# Patient Record
Sex: Female | Born: 1985 | Race: White | Hispanic: No | Marital: Married | Smoking: Former smoker
Health system: Southern US, Community
[De-identification: ages and names within clinical notes are randomized; demographics above are authoritative.]

## PROBLEM LIST (undated history)

## (undated) DIAGNOSIS — C50919 Malignant neoplasm of unspecified site of unspecified female breast: Secondary | ICD-10-CM

## (undated) DIAGNOSIS — C7800 Secondary malignant neoplasm of unspecified lung: Secondary | ICD-10-CM

---
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
## 2018-01-29 DIAGNOSIS — C7800 Secondary malignant neoplasm of unspecified lung: Secondary | ICD-10-CM

## 2018-01-29 DIAGNOSIS — C50919 Malignant neoplasm of unspecified site of unspecified female breast: Secondary | ICD-10-CM

## 2018-01-29 HISTORY — DX: Secondary malignant neoplasm of unspecified lung: C78.00

## 2018-01-29 HISTORY — DX: Malignant neoplasm of unspecified site of unspecified female breast: C50.919

## 2018-02-18 DIAGNOSIS — C773 Secondary and unspecified malignant neoplasm of axilla and upper limb lymph nodes: Secondary | ICD-10-CM | POA: Diagnosis not present

## 2018-02-18 DIAGNOSIS — Z171 Estrogen receptor negative status [ER-]: Secondary | ICD-10-CM | POA: Diagnosis not present

## 2018-02-18 DIAGNOSIS — C50112 Malignant neoplasm of central portion of left female breast: Secondary | ICD-10-CM | POA: Diagnosis not present

## 2018-02-20 DIAGNOSIS — C778 Secondary and unspecified malignant neoplasm of lymph nodes of multiple regions: Secondary | ICD-10-CM | POA: Diagnosis not present

## 2018-02-20 DIAGNOSIS — Z171 Estrogen receptor negative status [ER-]: Secondary | ICD-10-CM | POA: Diagnosis not present

## 2018-02-20 DIAGNOSIS — C50112 Malignant neoplasm of central portion of left female breast: Secondary | ICD-10-CM | POA: Diagnosis not present

## 2018-02-27 DIAGNOSIS — C7801 Secondary malignant neoplasm of right lung: Secondary | ICD-10-CM

## 2018-02-27 DIAGNOSIS — Z171 Estrogen receptor negative status [ER-]: Secondary | ICD-10-CM | POA: Diagnosis not present

## 2018-02-27 DIAGNOSIS — G893 Neoplasm related pain (acute) (chronic): Secondary | ICD-10-CM | POA: Diagnosis not present

## 2018-02-27 DIAGNOSIS — C778 Secondary and unspecified malignant neoplasm of lymph nodes of multiple regions: Secondary | ICD-10-CM | POA: Diagnosis not present

## 2018-02-27 DIAGNOSIS — C50112 Malignant neoplasm of central portion of left female breast: Secondary | ICD-10-CM | POA: Diagnosis not present

## 2018-03-04 DIAGNOSIS — I517 Cardiomegaly: Secondary | ICD-10-CM | POA: Diagnosis not present

## 2018-03-04 DIAGNOSIS — C50112 Malignant neoplasm of central portion of left female breast: Secondary | ICD-10-CM | POA: Diagnosis not present

## 2018-03-18 DIAGNOSIS — Z0001 Encounter for general adult medical examination with abnormal findings: Secondary | ICD-10-CM

## 2018-03-28 DIAGNOSIS — C50112 Malignant neoplasm of central portion of left female breast: Secondary | ICD-10-CM | POA: Diagnosis not present

## 2018-04-05 DIAGNOSIS — R0981 Nasal congestion: Secondary | ICD-10-CM

## 2018-04-05 DIAGNOSIS — R509 Fever, unspecified: Secondary | ICD-10-CM

## 2018-04-05 DIAGNOSIS — A419 Sepsis, unspecified organism: Secondary | ICD-10-CM | POA: Diagnosis not present

## 2018-04-05 DIAGNOSIS — D72829 Elevated white blood cell count, unspecified: Secondary | ICD-10-CM | POA: Diagnosis not present

## 2018-04-05 DIAGNOSIS — R05 Cough: Secondary | ICD-10-CM | POA: Diagnosis not present

## 2018-04-05 DIAGNOSIS — R1310 Dysphagia, unspecified: Secondary | ICD-10-CM

## 2018-04-05 DIAGNOSIS — B349 Viral infection, unspecified: Secondary | ICD-10-CM

## 2018-04-05 DIAGNOSIS — C50912 Malignant neoplasm of unspecified site of left female breast: Secondary | ICD-10-CM

## 2018-04-05 DIAGNOSIS — C50112 Malignant neoplasm of central portion of left female breast: Secondary | ICD-10-CM

## 2018-04-06 DIAGNOSIS — D72829 Elevated white blood cell count, unspecified: Secondary | ICD-10-CM | POA: Diagnosis not present

## 2018-04-06 DIAGNOSIS — C50112 Malignant neoplasm of central portion of left female breast: Secondary | ICD-10-CM

## 2018-04-06 DIAGNOSIS — J189 Pneumonia, unspecified organism: Secondary | ICD-10-CM | POA: Diagnosis not present

## 2018-04-06 DIAGNOSIS — J9601 Acute respiratory failure with hypoxia: Secondary | ICD-10-CM

## 2018-04-06 DIAGNOSIS — J069 Acute upper respiratory infection, unspecified: Secondary | ICD-10-CM | POA: Diagnosis not present

## 2018-04-06 DIAGNOSIS — D649 Anemia, unspecified: Secondary | ICD-10-CM | POA: Diagnosis not present

## 2018-04-06 DIAGNOSIS — R0902 Hypoxemia: Secondary | ICD-10-CM

## 2018-04-07 DIAGNOSIS — J9601 Acute respiratory failure with hypoxia: Secondary | ICD-10-CM | POA: Diagnosis not present

## 2018-04-07 DIAGNOSIS — D72829 Elevated white blood cell count, unspecified: Secondary | ICD-10-CM | POA: Diagnosis not present

## 2018-04-07 DIAGNOSIS — D649 Anemia, unspecified: Secondary | ICD-10-CM | POA: Diagnosis not present

## 2018-04-07 DIAGNOSIS — J189 Pneumonia, unspecified organism: Secondary | ICD-10-CM | POA: Diagnosis not present

## 2018-04-08 ENCOUNTER — Inpatient Hospital Stay (HOSPITAL_COMMUNITY)
Admission: AD | Admit: 2018-04-08 | Discharge: 2018-04-15 | DRG: 205 | Disposition: A | Discharge status: Home / Self Care | Payer: BLUE CROSS/BLUE SHIELD | Source: Other Acute Inpatient Hospital | Attending: Internal Medicine | Admitting: Internal Medicine

## 2018-04-08 ENCOUNTER — Inpatient Hospital Stay (HOSPITAL_COMMUNITY): Payer: BLUE CROSS/BLUE SHIELD

## 2018-04-08 ENCOUNTER — Encounter (HOSPITAL_COMMUNITY): Payer: Self-pay | Admitting: Infectious Diseases

## 2018-04-08 DIAGNOSIS — Z1501 Genetic susceptibility to malignant neoplasm of breast: Secondary | ICD-10-CM | POA: Diagnosis not present

## 2018-04-08 DIAGNOSIS — R000 Tachycardia, unspecified: Secondary | ICD-10-CM

## 2018-04-08 DIAGNOSIS — Z170 Estrogen receptor positive status [ER+]: Secondary | ICD-10-CM | POA: Diagnosis not present

## 2018-04-08 DIAGNOSIS — J181 Lobar pneumonia, unspecified organism: Secondary | ICD-10-CM | POA: Diagnosis not present

## 2018-04-08 DIAGNOSIS — E8770 Fluid overload, unspecified: Secondary | ICD-10-CM | POA: Diagnosis present

## 2018-04-08 DIAGNOSIS — C7800 Secondary malignant neoplasm of unspecified lung: Secondary | ICD-10-CM | POA: Diagnosis present

## 2018-04-08 DIAGNOSIS — Z9221 Personal history of antineoplastic chemotherapy: Secondary | ICD-10-CM | POA: Diagnosis not present

## 2018-04-08 DIAGNOSIS — R509 Fever, unspecified: Secondary | ICD-10-CM | POA: Diagnosis present

## 2018-04-08 DIAGNOSIS — D649 Anemia, unspecified: Secondary | ICD-10-CM | POA: Diagnosis not present

## 2018-04-08 DIAGNOSIS — T451X5A Adverse effect of antineoplastic and immunosuppressive drugs, initial encounter: Secondary | ICD-10-CM | POA: Diagnosis present

## 2018-04-08 DIAGNOSIS — J189 Pneumonia, unspecified organism: Secondary | ICD-10-CM | POA: Diagnosis present

## 2018-04-08 DIAGNOSIS — Z809 Family history of malignant neoplasm, unspecified: Secondary | ICD-10-CM | POA: Diagnosis not present

## 2018-04-08 DIAGNOSIS — R0989 Other specified symptoms and signs involving the circulatory and respiratory systems: Secondary | ICD-10-CM | POA: Diagnosis not present

## 2018-04-08 DIAGNOSIS — J80 Acute respiratory distress syndrome: Secondary | ICD-10-CM

## 2018-04-08 DIAGNOSIS — Z79899 Other long term (current) drug therapy: Secondary | ICD-10-CM | POA: Diagnosis not present

## 2018-04-08 DIAGNOSIS — Z95828 Presence of other vascular implants and grafts: Secondary | ICD-10-CM | POA: Diagnosis not present

## 2018-04-08 DIAGNOSIS — Z880 Allergy status to penicillin: Secondary | ICD-10-CM | POA: Diagnosis not present

## 2018-04-08 DIAGNOSIS — Z885 Allergy status to narcotic agent status: Secondary | ICD-10-CM | POA: Diagnosis not present

## 2018-04-08 DIAGNOSIS — C50919 Malignant neoplasm of unspecified site of unspecified female breast: Secondary | ICD-10-CM

## 2018-04-08 DIAGNOSIS — C50912 Malignant neoplasm of unspecified site of left female breast: Secondary | ICD-10-CM

## 2018-04-08 DIAGNOSIS — R0602 Shortness of breath: Secondary | ICD-10-CM

## 2018-04-08 DIAGNOSIS — R918 Other nonspecific abnormal finding of lung field: Secondary | ICD-10-CM

## 2018-04-08 DIAGNOSIS — J9601 Acute respiratory failure with hypoxia: Secondary | ICD-10-CM | POA: Diagnosis present

## 2018-04-08 DIAGNOSIS — F329 Major depressive disorder, single episode, unspecified: Secondary | ICD-10-CM | POA: Diagnosis present

## 2018-04-08 DIAGNOSIS — R0902 Hypoxemia: Secondary | ICD-10-CM

## 2018-04-08 DIAGNOSIS — F419 Anxiety disorder, unspecified: Secondary | ICD-10-CM | POA: Diagnosis present

## 2018-04-08 DIAGNOSIS — J704 Drug-induced interstitial lung disorders, unspecified: Secondary | ICD-10-CM | POA: Diagnosis present

## 2018-04-08 DIAGNOSIS — D72829 Elevated white blood cell count, unspecified: Secondary | ICD-10-CM | POA: Diagnosis not present

## 2018-04-08 DIAGNOSIS — R197 Diarrhea, unspecified: Secondary | ICD-10-CM | POA: Diagnosis not present

## 2018-04-08 DIAGNOSIS — J9690 Respiratory failure, unspecified, unspecified whether with hypoxia or hypercapnia: Secondary | ICD-10-CM

## 2018-04-08 DIAGNOSIS — J168 Pneumonia due to other specified infectious organisms: Secondary | ICD-10-CM | POA: Diagnosis not present

## 2018-04-08 DIAGNOSIS — R110 Nausea: Secondary | ICD-10-CM | POA: Diagnosis not present

## 2018-04-08 HISTORY — DX: Secondary malignant neoplasm of unspecified lung: C78.00

## 2018-04-08 HISTORY — DX: Malignant neoplasm of unspecified site of unspecified female breast: C50.919

## 2018-04-08 LAB — GLUCOSE, CAPILLARY: Glucose-Capillary: 131 mg/dL — ABNORMAL HIGH (ref 70–99)

## 2018-04-08 LAB — PHOSPHORUS: Phosphorus: 1.8 mg/dL — ABNORMAL LOW (ref 2.5–4.6)

## 2018-04-08 LAB — COMPREHENSIVE METABOLIC PANEL
ALK PHOS: 119 U/L (ref 38–126)
ALT: 20 U/L (ref 0–44)
ANION GAP: 9 (ref 5–15)
AST: 37 U/L (ref 15–41)
Albumin: 2.8 g/dL — ABNORMAL LOW (ref 3.5–5.0)
BILIRUBIN TOTAL: 0.2 mg/dL — AB (ref 0.3–1.2)
BUN: <5 mg/dL — ABNORMAL LOW (ref 6–20)
CALCIUM: 8.7 mg/dL — AB (ref 8.9–10.3)
CO2: 23 mmol/L (ref 22–32)
CREATININE: 0.58 mg/dL (ref 0.44–1.00)
Chloride: 106 mmol/L (ref 98–111)
GFR calc non Af Amer: >60 mL/min (ref >60)
Glucose, Bld: 128 mg/dL — ABNORMAL HIGH (ref 70–99)
Potassium: 4.5 mmol/L (ref 3.5–5.1)
SODIUM: 138 mmol/L (ref 135–145)
Total Protein: 6.4 g/dL — ABNORMAL LOW (ref 6.5–8.1)

## 2018-04-08 LAB — LACTIC ACID, PLASMA
LACTIC ACID, VENOUS: 1.4 mmol/L (ref 0.5–1.9)
LACTIC ACID, VENOUS: 1 mmol/L (ref 0.5–1.9)

## 2018-04-08 LAB — MAGNESIUM: Magnesium: 1.9 mg/dL (ref 1.7–2.4)

## 2018-04-08 LAB — URINALYSIS, ROUTINE W REFLEX MICROSCOPIC
Bacteria, UA: NONE SEEN
Bilirubin Urine: NEGATIVE
Glucose, UA: NEGATIVE mg/dL
KETONES UR: NEGATIVE mg/dL
Leukocytes, UA: NEGATIVE
Nitrite: NEGATIVE
PROTEIN: NEGATIVE mg/dL
Specific Gravity, Urine: 1.009 (ref 1.005–1.030)
pH: 7 (ref 5.0–8.0)

## 2018-04-08 LAB — CBC
HEMATOCRIT: 32.9 % — AB (ref 36.0–46.0)
Hemoglobin: 10.4 g/dL — ABNORMAL LOW (ref 12.0–15.0)
MCH: 30.5 pg (ref 26.0–34.0)
MCHC: 31.6 g/dL (ref 30.0–36.0)
MCV: 96.5 fL (ref 78.0–100.0)
Platelets: 263 10*3/uL (ref 150–400)
RBC: 3.41 MIL/uL — AB (ref 3.87–5.11)
RDW: 14.8 % (ref 11.5–15.5)
WBC: 48 10*3/uL — ABNORMAL HIGH (ref 4.0–10.5)

## 2018-04-08 LAB — CORTISOL: CORTISOL PLASMA: 10.2 ug/dL

## 2018-04-08 LAB — APTT: APTT: 33 s (ref 24–36)

## 2018-04-08 LAB — MRSA PCR SCREENING: MRSA BY PCR: NEGATIVE

## 2018-04-08 LAB — PROCALCITONIN: Procalcitonin: 0.19 ng/mL

## 2018-04-08 LAB — PROTIME-INR
INR: 1.14
Prothrombin Time: 14.5 seconds (ref 11.4–15.2)

## 2018-04-08 LAB — BRAIN NATRIURETIC PEPTIDE: B NATRIURETIC PEPTIDE 5: 326.3 pg/mL — AB (ref 0.0–100.0)

## 2018-04-08 MED ORDER — WHITE PETROLATUM EX OINT
TOPICAL_OINTMENT | CUTANEOUS | Status: AC
  Administered 2018-04-08: 1
  Filled 2018-04-08: qty 28.35

## 2018-04-08 MED ORDER — LEVALBUTEROL HCL 0.63 MG/3ML IN NEBU
INHALATION_SOLUTION | RESPIRATORY_TRACT | Status: AC
  Administered 2018-04-08: 0.63 mg via RESPIRATORY_TRACT
  Filled 2018-04-08: qty 3

## 2018-04-08 MED ORDER — SODIUM CHLORIDE 0.9 % IV SOLN
250.0000 mL | INTRAVENOUS | Status: DC | PRN
  Administered 2018-04-08 – 2018-04-09 (×2): 250 mL via INTRAVENOUS

## 2018-04-08 MED ORDER — OXYCODONE HCL 5 MG PO TABS
5.0000 mg | ORAL_TABLET | Freq: Four times a day (QID) | ORAL | Status: DC | PRN
  Administered 2018-04-08 – 2018-04-10 (×7): 5 mg via ORAL
  Filled 2018-04-08 (×7): qty 1

## 2018-04-08 MED ORDER — FAMOTIDINE IN NACL 20-0.9 MG/50ML-% IV SOLN
20.0000 mg | Freq: Two times a day (BID) | INTRAVENOUS | Status: DC
  Administered 2018-04-08 – 2018-04-09 (×3): 20 mg via INTRAVENOUS
  Filled 2018-04-08 (×4): qty 50

## 2018-04-08 MED ORDER — ONDANSETRON HCL 4 MG/2ML IJ SOLN: 4.0000 mg | Freq: Four times a day (QID) | INTRAMUSCULAR | Status: DC

## 2018-04-08 MED ORDER — MIDAZOLAM HCL 2 MG/2ML IJ SOLN: 1.0000 mg | INTRAMUSCULAR | Status: DC | PRN

## 2018-04-08 MED ORDER — HEPARIN SODIUM (PORCINE) 5000 UNIT/ML IJ SOLN
5000.0000 [IU] | Freq: Three times a day (TID) | INTRAMUSCULAR | Status: DC
  Administered 2018-04-08 – 2018-04-11 (×9): 5000 [IU] via SUBCUTANEOUS
  Filled 2018-04-08 (×8): qty 1

## 2018-04-08 MED ORDER — SODIUM CHLORIDE 0.9 % IV SOLN
1250.0000 mg | Freq: Three times a day (TID) | INTRAVENOUS | Status: DC
  Administered 2018-04-08 – 2018-04-09 (×3): 1250 mg via INTRAVENOUS
  Filled 2018-04-08 (×4): qty 1250

## 2018-04-08 MED ORDER — ONDANSETRON HCL 4 MG/2ML IJ SOLN
4.0000 mg | Freq: Four times a day (QID) | INTRAMUSCULAR | Status: DC | PRN
  Administered 2018-04-08 – 2018-04-12 (×8): 4 mg via INTRAVENOUS
  Filled 2018-04-08 (×8): qty 2

## 2018-04-08 MED ORDER — SODIUM CHLORIDE 0.9 % IV SOLN
INTRAVENOUS | Status: DC
  Administered 2018-04-08: 15:00:00 via INTRAVENOUS

## 2018-04-08 MED ORDER — SODIUM CHLORIDE 0.9 % IV SOLN
2.0000 g | Freq: Three times a day (TID) | INTRAVENOUS | Status: DC
  Administered 2018-04-08 – 2018-04-14 (×18): 2 g via INTRAVENOUS
  Filled 2018-04-08 (×19): qty 2

## 2018-04-08 MED ORDER — LEVALBUTEROL HCL 0.63 MG/3ML IN NEBU
0.6300 mg | INHALATION_SOLUTION | RESPIRATORY_TRACT | Status: DC | PRN
  Administered 2018-04-08 – 2018-04-10 (×5): 0.63 mg via RESPIRATORY_TRACT
  Filled 2018-04-08 (×4): qty 3

## 2018-04-08 NOTE — Progress Notes (Signed)
Upon arrival to patient room to do assessment patient stated that she was feeling nauseated.  Patient was taken off of bipap and placed on 15L salter high-flow nasal cannula and told RN in order to give her some medication.  Currently tolerating well.  Will continue to monitor.

## 2018-04-08 NOTE — Progress Notes (Signed)
Pharmacy Antibiotic Note  Kelly Hubbard is a 32 y.o. female admitted on 04/08/2018 with pneumonia.  Pharmacy has been consulted for vancomycin and cefepime dosing. Patient transferred from [facility] where she was on Vancomycin 1 gm IV q 8 hours (Trough on vanc 1.5 gm q 12 was low at 9.8). Last dose was yesterday. She was also on Aztreonam, bactrim and caspofungin. Patient's husband concerned she may be allergic to Zosyn (Rash). Of note, today is D#4 of abx   Cultures at OSH:  7/5 BCx NGTD 6/5 Sputum > normal floa 7/5 BCx NGTD MRSA PCR neg   Plan: -Start Cefepime 2 gm IV Q 8 hours  -Start vancomycin 1250 mg IV Q 8 hours -Monitor CBC, renal fx, cultures and clinical progress -VT at SS      Temp (24hrs), Avg:98.5 F (36.9 C), Min:98.5 F (36.9 C), Max:98.5 F (36.9 C)  No results for input(s): WBC, CREATININE, LATICACIDVEN, VANCOTROUGH, VANCOPEAK, VANCORANDOM, GENTTROUGH, GENTPEAK, GENTRANDOM, TOBRATROUGH, TOBRAPEAK, TOBRARND, AMIKACINPEAK, AMIKACINTROU, AMIKACIN in the last 168 hours.  CrCl cannot be calculated (No order found.).    Allergies not on file  Antimicrobials this admission: Vanc 7/9 >>  Cefepime 7/9 >>   Dose adjustments this admission: None   Microbiology results:   Thank you for allowing pharmacy to be a part of this patient's care.  Albertina Parr, PharmD., BCPS Clinical Pharmacist Clinical phone for 04/08/18 until 3:30pm: [phone] If after 3:30pm, please refer to [facility] for unit-specific pharmacist

## 2018-04-08 NOTE — Progress Notes (Signed)
Patient arrived from [facility] wearing bipap and placed on our bipap and was currently tolerating well.  Will continue to monitor.

## 2018-04-08 NOTE — Consult Note (Signed)
[name] for Infectious Disease    Date of Admission:  04/08/2018   Total days of antibiotics 3        Day 2 Vancomycin         Day 1 Cefepime               Reason for Consult: HCAP in cancer patient     Referring Provider: [facility]  Primary Care Provider: Patient, No Pcp Per   Assessment: 32 y.o. 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 She is receiving Trastuzumab and Trastuzumab as part of her cancer treatment. She has significant leukocytosis in the setting of possible pneumonia as well as Neulasta injections - uncertain what her WBC/ANC counts were like baseline. Her lung exam is abnormal with crackles L>R and overall decreased breath sounds. Requiring 65% oxygen. I do think that some degree of fluid overload is playing a role and may need to consider diuresis if she remains stable hemodynamically. She is getting Herceptin/Perjeta which is known for cardiotoxic effect; she tells me she had an echocardiogram prior to therapy but may consider re-evaluating. With negative nasal PCR would continue Cefepime for now. No culture data to support MRSA pneumonia. Agree with stopping antifungal therapy and no indication for carbapenem.   Plan: 1. Stop Vancomycin  2. Continue Cefepime    Will follow along.   Active Problems:   Acute respiratory failure with hypoxemia (HCC)   Fever   . heparin  5,000 Units Subcutaneous Q8H    HPI: Kelly Hubbard is a 32 y.o. female transferred to [facility] ICU from [place] ICU on 04/08/2018 for management of her worsening respiratory failure/HCAP. 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 She also receives Neulasta (last dose 03/29/18). She has not had surgery or XRT per records and husband's account.   In discussion with her and her husband last Tuesday 7/2 noticed she had a slight fever of 100 F. They called to discuss with oncology team - no other symptoms outside of isolated temperature so recommended observation and tylenol/ibuprofen. No more fevers until Friday around noon when it was noticed she started with a cough. Temperature later that evening up to 100.8 F and now progressive shortness of breath and productive cough with reported green phlegm. Her husband then brought her to the hospital for evaluation. Other associated symptoms that evolved since Friday include sore throat, runny nose/congestion. She presented to the hospital with fever of 100.8 F. Initiation of antibiotics initially made her feel better but then progressed to have worsened respiratory status requiring BiPAP after aggressive IVF (6 L per her husband's account) in the setting of hypotension. She is now breathing much more comfortably with BiPAP and tells me she is going to trial off of this later this evening. Sore throat has resolved and cough is no longer productive. No fevers recorded since 7/7.   There is question of PCN allergy as she had a "reaction" to what seemed to be piperacillin-tazobactam. Her husband describes that this included "severe muscle spasms that would happen about an hour after infusions." He ultimately requested to change medications.   Regarding antibiotic  regimen - Initially was treated with Ceftriaxone on 7/6 at ED presentation. Changed to vancomycin + pip-tazo with admission. 7/7 continued vancomycin with aztreonam (?reaction to pip-tazo) and added azithromycin. 7/8 added bactrim and capsofungin. Prednisone 25m QD was also added. Prior to transfer the vancomycin was stopped with negative MRSA PCR as well as bactrim and transitioned to Imipenem.   Review of  Systems  Constitutional: Positive for fever and malaise/fatigue. Negative for chills and weight loss.  HENT: Positive for congestion and sore throat. Negative for sinus pain.   Eyes: Positive for pain.  Respiratory: Positive for cough, sputum production and shortness of breath. Negative for hemoptysis and wheezing.   Cardiovascular: Positive for chest pain and leg swelling.  Gastrointestinal: Negative for abdominal pain, diarrhea and vomiting.  Genitourinary: Negative for dysuria and hematuria.  Musculoskeletal: Negative for joint pain and myalgias.  Skin: Negative for rash.  Neurological: Positive for headaches.    Past Medical History:  Diagnosis Date  . Breast cancer metastasized to lung ([facility] 01/2018    Social History   Tobacco Use  . Smoking status: Not on file  Substance Use Topics  . Alcohol use: Not on file  . Drug use: Not on file    Family History  Problem Relation Age of Onset  . Heart Problems Mother   . Hypertension Mother   . Diabetes Father   . Cancer Maternal Grandmother   . Stroke Maternal Grandmother    Allergies  Allergen Reactions  . Meperidine And Related Hives    OBJECTIVE: Blood pressure 115/79, pulse 100, temperature 98.5 F (36.9 C), temperature source Oral, resp. rate (!) 29, height 4' 11"  (1.499 m), weight 147 lb 11.3 oz (67 kg), last menstrual period 04/08/2018, SpO2 98 %.  Physical Exam  Constitutional: She is oriented to person, place, and time. She appears well-developed. No distress.  Ill-appearing young woman in bed  HENT:  Mouth/Throat: Oropharynx is clear and moist.  Eyes: Pupils are equal, round, and reactive to light. Right eye exhibits no discharge. Left eye exhibits no discharge. No scleral icterus.  Neck: No JVD present.  Cardiovascular: Normal rate, regular rhythm and normal heart sounds.  No murmur heard. Pulmonary/Chest: Effort normal. No respiratory distress. She has no wheezes. She has rales (L>R).  Abdominal: Soft.  Bowel sounds are normal. She exhibits no distension. There is no tenderness.  Musculoskeletal: She exhibits no edema.  Lymphadenopathy:    She has no cervical adenopathy.  Neurological: She is alert and oriented to person, place, and time.  Skin: Skin is warm and dry.  Psychiatric: She has a normal mood and affect. Judgment normal.    Lab Results Lab Results  Component Value Date   WBC 48.0 (H) 04/08/2018   HGB 10.4 (L) 04/08/2018   HCT 32.9 (L) 04/08/2018   MCV 96.5 04/08/2018   PLT 263 04/08/2018    Lab Results  Component Value Date   CREATININE 0.58 04/08/2018   BUN <5 (L) 04/08/2018   NA 138 04/08/2018   K 4.5 04/08/2018   CL 106 04/08/2018   CO2 23 04/08/2018    Lab Results  Component Value Date   ALT 20 04/08/2018   AST 37 04/08/2018   ALKPHOS 119 04/08/2018   BILITOT 0.2 (L) 04/08/2018     Microbiology: Blood Cx 7/9>>> Urine Cx 7/9>>> Sputum Cx 7/9>>>  [phone] Strep A screen - neg  Blood Culture - No growth from either blood cultures @ 24h  Resp Cx - normal  respiratory flora  MRSA PCR - neg  PJP screen - never collected  RVP - pending   Janene Madeira, MSN, NP-C [facility] for Infectious [place] Cell: [phone] Pager: [phone]  04/08/2018 5:35 PM   \

## 2018-04-08 NOTE — H&P (Signed)
PULMONARY / CRITICAL CARE MEDICINE   Name: Kelly Hubbard MRN: [id number] DOB: 25-Dec-1985    ADMISSION DATE:  04/08/2018 CONSULTATION DATE:  04/08/2018  REFERRING MD:  Olga Coaster hospitalis  CHIEF COMPLAINT:  HCAP and Acute respiratory failure  HISTORY OF PRESENT ILLNESS:   32 year old female with recent diagnosis (01/2018) of stage IV breast cancer with mets to the lungs HER2 positive that is on 2 biologics, no RT or surgical interventions.  Patient is on a BiPAP and history is very hard to obtain.  From records, presents to [place] with SOB and was noted to have multiple opacities on chest CT with worsening infiltrate.  Patient was transferred to the ICU in [place] and continued to deteriorate and BiPAP was started and PCCM was asked to accept on transfer on 7/9.    PAST MEDICAL HISTORY :  Stage IV breast adeno with mets to the lungs  PAST SURGICAL HISTORY: TAH-BSO  Allergies not on file ?PEN  No current facility-administered medications on file prior to encounter.    No current outpatient medications on file prior to encounter.   FAMILY HISTORY:  Her family history is not on file.  SOCIAL HISTORY: She    REVIEW OF SYSTEMS:   Unattainable  SUBJECTIVE:  SOB and cough  VITAL SIGNS: Temp 98.5 F (36.9 C) (Axillary)   HEMODYNAMICS:  HR 110, BP 113/75 and sat of 99% on BiPAP 10/5 and FiO2 of 65%  VENTILATOR SETTINGS:    INTAKE / OUTPUT: No intake/output data recorded.  PHYSICAL EXAMINATION: General:  Acute on chronically ill appearing female, NAD Neuro:  Alert and interactive but difficulty to understand due to BiPAP, moving all ext to command HEENT:  [place]/AT, PERRL, EOM-I and MMM Cardiovascular:  RRR, Nl S1/S2 and -M/R/G Lungs:  Diffuse rales Abdomen:  Soft, NT, ND and +BS Musculoskeletal:  -edema and -tenderness Skin:  Intact  LABS:  BMET No results for input(s): NA, K, CL, CO2, BUN, CREATININE, GLUCOSE in the last 168  hours.  Electrolytes No results for input(s): CALCIUM, MG, PHOS in the last 168 hours.  CBC No results for input(s): WBC, HGB, HCT, PLT in the last 168 hours.  Coag's No results for input(s): APTT, INR in the last 168 hours.  Sepsis Markers No results for input(s): LATICACIDVEN, PROCALCITON, O2SATVEN in the last 168 hours.  ABG No results for input(s): PHART, PCO2ART, PO2ART in the last 168 hours.  Liver Enzymes No results for input(s): AST, ALT, ALKPHOS, BILITOT, ALBUMIN in the last 168 hours.  Cardiac Enzymes No results for input(s): TROPONINI, PROBNP in the last 168 hours.  Glucose Recent Labs  Lab 04/08/18 1324  GLUCAP 131*    Imaging CXR that I reviewed myself, bibasilar infiltrate noted  STUDIES:  CXR that I reviewed myself with bibasilar infiltrates noted  CULTURES: Blood 7/9>>> Urine 7/9>>> Sputum 7/9>>>  ANTIBIOTICS: Cefepime 7/9>>> Vancomycin 7/9>>>  SIGNIFICANT EVENTS:  7/9>>>transfer to [facility]ICU for respiratory failure from HCAP  LINES/TUBES: R [place]  DISCUSSION: 32 year old female with stage 4 breast cancer presenting to PCCM with respiratory failure from HCAP and lung mets from breast cancer.  Discussed with PCCM-NP.  ASSESSMENT / PLAN:  PULMONARY A: Acute respiratory failure P:   - BiPAP - Ok to intubate if needs be - Treat PNA - May need input from oncology for pulmonary mets  CARDIOVASCULAR A:  Tachycardia P:  - Tele monitoring - IVF resuscitation  RENAL A:   No active issues P:   -  IVF resuscitation - BMET - Replace electrolytes as indicated  GASTROINTESTINAL A:   No active issues P:   - NPO while on BiPAP - Monitor for aspiration - Pepcid 20 BID  HEMATOLOGIC A:   Leukocytosis P:  - Treat infection - CBC in AM - Transfuse per ICU protocol  INFECTIOUS A:   HCAP P:   - Cefepime - Vanc - F/u on culture - ID consult called  ENDOCRINE A:   No active issues   P:   - Monitro  NEUROLOGIC A:    No active issues P:   RASS goal: N/A - Avoid sedating medications   FAMILY  - Updates: Full code status, treat infection, f/u on cultures.  Husband is to bring CD with all images from [place] in AM.  - Inter-disciplinary family meet or Palliative Care meeting due by:  day 7  The patient is critically ill with multiple organ systems failure and requires high complexity decision making for assessment and support, frequent evaluation and titration of therapies, application of advanced monitoring technologies and extensive interpretation of multiple databases.   Critical Care Time devoted to patient care services described in this note is  45  Minutes. This time reflects time of care of this signee Dr Jennet Maduro. This critical care time does not reflect procedure time, or teaching time or supervisory time of PA/NP/Med student/Med Resident etc but could involve care discussion time.  Rush Farmer, M.D. [facility] Pulmonary/Critical Care Medicine. Pager: [phone]. After hours pager: [phone].  04/08/2018, 1:39 PM

## 2018-04-09 ENCOUNTER — Other Ambulatory Visit: Payer: Self-pay

## 2018-04-09 ENCOUNTER — Encounter (HOSPITAL_COMMUNITY): Payer: Self-pay

## 2018-04-09 DIAGNOSIS — Z885 Allergy status to narcotic agent status: Secondary | ICD-10-CM

## 2018-04-09 DIAGNOSIS — R110 Nausea: Secondary | ICD-10-CM

## 2018-04-09 DIAGNOSIS — Z95828 Presence of other vascular implants and grafts: Secondary | ICD-10-CM

## 2018-04-09 LAB — BASIC METABOLIC PANEL
Anion gap: 7 (ref 5–15)
BUN: 6 mg/dL (ref 6–20)
CO2: 26 mmol/L (ref 22–32)
CREATININE: 0.67 mg/dL (ref 0.44–1.00)
Calcium: 8.5 mg/dL — ABNORMAL LOW (ref 8.9–10.3)
Chloride: 107 mmol/L (ref 98–111)
GFR calc Af Amer: >60 mL/min (ref >60)
GFR calc non Af Amer: >60 mL/min (ref >60)
GLUCOSE: 108 mg/dL — AB (ref 70–99)
Potassium: 4.8 mmol/L (ref 3.5–5.1)
SODIUM: 140 mmol/L (ref 135–145)

## 2018-04-09 LAB — CBC
HEMATOCRIT: 29.5 % — AB (ref 36.0–46.0)
Hemoglobin: 9.3 g/dL — ABNORMAL LOW (ref 12.0–15.0)
MCH: 30.9 pg (ref 26.0–34.0)
MCHC: 31.5 g/dL (ref 30.0–36.0)
MCV: 98 fL (ref 78.0–100.0)
Platelets: 257 10*3/uL (ref 150–400)
RBC: 3.01 MIL/uL — ABNORMAL LOW (ref 3.87–5.11)
RDW: 15.1 % (ref 11.5–15.5)
WBC: 43.3 10*3/uL — ABNORMAL HIGH (ref 4.0–10.5)

## 2018-04-09 LAB — HIV ANTIBODY (ROUTINE TESTING W REFLEX): HIV Screen 4th Generation wRfx: NONREACTIVE

## 2018-04-09 LAB — PHOSPHORUS: Phosphorus: 2.4 mg/dL — ABNORMAL LOW (ref 2.5–4.6)

## 2018-04-09 LAB — MAGNESIUM: Magnesium: 2 mg/dL (ref 1.7–2.4)

## 2018-04-09 LAB — STREP PNEUMONIAE URINARY ANTIGEN: Strep Pneumo Urinary Antigen: NEGATIVE

## 2018-04-09 MED ORDER — KETOROLAC TROMETHAMINE 15 MG/ML IJ SOLN
7.5000 mg | Freq: Four times a day (QID) | INTRAMUSCULAR | Status: AC | PRN
  Administered 2018-04-09 – 2018-04-10 (×3): 7.5 mg via INTRAVENOUS
  Administered 2018-04-10: 15 mg via INTRAVENOUS
  Filled 2018-04-09 (×7): qty 1

## 2018-04-09 MED ORDER — ACETAMINOPHEN 10 MG/ML IV SOLN
1000.0000 mg | Freq: Four times a day (QID) | INTRAVENOUS | Status: AC | PRN
  Administered 2018-04-10: 1000 mg via INTRAVENOUS
  Filled 2018-04-09 (×2): qty 100

## 2018-04-09 MED ORDER — FUROSEMIDE 10 MG/ML IJ SOLN
20.0000 mg | Freq: Once | INTRAMUSCULAR | Status: AC
  Administered 2018-04-09: 20 mg via INTRAVENOUS
  Filled 2018-04-09: qty 2

## 2018-04-09 NOTE — Progress Notes (Signed)
RT note: patient resting comfortably on non-rebreather mask at this time with no distress noted.  Instructed patient that when she felt comfortable and ready we would switch off of non-rebreather mask and place on salter high flow cannula.  Patient expressed understanding and asked to stay on non-rebreather for time being.  Bipap currently not indicated at this time.  Will continue to monitor and assess.

## 2018-04-09 NOTE — Progress Notes (Signed)
PULMONARY / CRITICAL CARE MEDICINE   Name: Kelly Hubbard MRN: [id number] DOB: Sep 11, 1986    ADMISSION DATE:  04/08/2018 CONSULTATION DATE:  04/08/2018  REFERRING MD:  Olga Coaster hospitalis  CHIEF COMPLAINT:  HCAP and Acute respiratory failure  HISTORY OF PRESENT ILLNESS:   32 year old female with recent diagnosis (01/2018) of stage IV breast cancer with mets to the lungs HER2 positive that is on 2 biologics, no RT or surgical interventions.  Patient is on a BiPAP and history is very hard to obtain.  From records, presents to [place] with SOB and was noted to have multiple opacities on chest CT with worsening infiltrate.  Patient was transferred to the ICU in [place] and continued to deteriorate. BiPAP was started and PCCM was asked to accept on transfer on 7/9.    SUBJECTIVE:  No acute changes overnight, patient says she feels more comfortable with her breathing on the NRB mask. She has no other complaints this morning.   VITAL SIGNS: BP 104/69 (BP Location: Left Arm)   Pulse (!) 112   Temp 98.5 F (36.9 C) (Axillary)   Resp 20   Ht 4' 11"  (1.499 m)   Wt 147 lb 11.3 oz (67 kg)   LMP 04/08/2018 (Exact Date)   SpO2 96%   BMI 29.83 kg/m   HEMODYNAMICS: 96% on NRB at 15L  VENTILATOR SETTINGS: Vent Mode: BIPAP FiO2 (%):  [65 %-100 %] 100 % Set Rate:  [10 bmp] 10 bmp PEEP:  [5 cmH20] 5 cmH20  INTAKE / OUTPUT: I/O last 3 completed shifts: In: 2559.5 [I.V.:1473.2; IV Piggyback:1086.3] Out: 1750 [Urine:1750]  PHYSICAL EXAMINATION: General: NAD, ill appearing female  HEENT: [place]/AT, PERRL, MMM Cardiovascular: tachycardic with regular rhythm, no m/r/g, no LE edema Respiratory: Course breath sounds throughout with rales noted in the bases  Gastrointestinal: soft, normoactive BS Extremities: warm, dry, moves all 4 extremities equally Derm: no rashes appreciated Neuro: alert and interactive Psych: AOx3, appropriate affect  LABS:  BMET Recent Labs  Lab  04/08/18 1410 04/09/18 0308  NA 138 140  K 4.5 4.8  CL 106 107  CO2 23 26  BUN <5* 6  CREATININE 0.58 0.67  GLUCOSE 128* 108*    Electrolytes Recent Labs  Lab 04/08/18 1410 04/09/18 0308  CALCIUM 8.7* 8.5*  MG 1.9 2.0  PHOS 1.8* 2.4*    CBC Recent Labs  Lab 04/08/18 1410 04/09/18 0308  WBC 48.0* 43.3*  HGB 10.4* 9.3*  HCT 32.9* 29.5*  PLT 263 257    Coag's Recent Labs  Lab 04/08/18 1410  APTT 33  INR 1.14    Sepsis Markers Recent Labs  Lab 04/08/18 1410 04/08/18 1743  LATICACIDVEN 1.4 1.0  PROCALCITON 0.19  --     ABG No results for input(s): PHART, PCO2ART, PO2ART in the last 168 hours.  Liver Enzymes Recent Labs  Lab 04/08/18 1410  AST 37  ALT 20  ALKPHOS 119  BILITOT 0.2*  ALBUMIN 2.8*    Cardiac Enzymes No results for input(s): TROPONINI, PROBNP in the last 168 hours.  Glucose Recent Labs  Lab 04/08/18 1324  GLUCAP 131*    Imaging CXR that I reviewed myself, bibasilar infiltrate noted  STUDIES:  CXR that I reviewed myself with bibasilar infiltrates noted  CULTURES: Blood 7/9>>> Urine 7/9>>> Sputum 7/9>>>  ANTIBIOTICS: Cefepime 7/9>>> Vancomycin 7/9>>>  SIGNIFICANT EVENTS:  7/9>>>transfer to [facility] for respiratory failure from HCAP  LINES/TUBES: R [place]  DISCUSSION: 32 year old female with  stage 4 breast cancer presenting to PCCM with respiratory failure from HCAP and lung mets from breast cancer.  Discussed with PCCM-NP.  ASSESSMENT / PLAN:  PULMONARY A: Acute respiratory failure HCAP P:   NRB for work of breathing Continue abx as below May need to consult oncology for pulmonary mets  CARDIOVASCULAR A:  Tachycardia- stable P:  Continuous cardiac monitoring   RENAL A:   No active issues P:   Trend BMP / urinary output Replace electrolytes as indicated Avoid nephrotoxic agents, ensure adequate renal perfusion  GASTROINTESTINAL A:   No active issues P:   Pepcid for  prophylaxis  HEMATOLOGIC A:   Leukocytosis P:  Continue abx CBC Transfuse per protocol  INFECTIOUS A:   HCAP P:   Continue cefepime and vancomycin F/u on cultures ID following  ENDOCRINE A:   No active issues   P:   Monitor for any changes   NEUROLOGIC A:   No active issues P:   Avoid sedating medications  FAMILY  - Updates: Husband is to bring CD with all images from [place] 7/10. Stable for floor on NRB.   - Inter-disciplinary family meet or Palliative Care meeting due by:  day 7  [name], DO PGY-2, [facility]  04/09/2018, 8:39 AM

## 2018-04-09 NOTE — Progress Notes (Signed)
[place] for Infectious Disease  Date of Admission:  04/08/2018   Total days of antibiotics 4        Day 2 Cefepime           ASSESSMENT: Kynli is a 32 y.o. female with stage 4 L breast cancer with metastasis to the lung now with acute respiratory failure requiring bipap and fever concerning for health care associated pneumonia. She has been narrowed to Cefepime only as of now after having previously received extremely broad antimicrobial coverage. She has had no further fevers > 99.5 F and cough is non-productive, however she remains tachypneic with high oxygen requirements. No cultures have yielded anything yet to target; respiratory viral panel is pending per [name] will be back tomorrow. Will continue cefepime for now. Low suspicion for PJP with acute onset of dyspnea/hypoxia. May need to consider adding back atypical coverage with azithro. Check strep pneumo urine antigen.   If she does not have significant response/decrease in oxygen requirement soon would recommend trial of diuretic. ?Bronchoscopy for further diagnostic assistance - her husband is bringing disk of CT images from [place] today. Also ?pulmonary effect from chemotherapy agents -->?HRCT of chest.   PLAN: 1. Continue cefepime for now 2. Consider trial of diuretic  3. Call [facility] lab tomorrow @ [phone] regarding Resp Viral Panel  Active Problems:   Acute respiratory failure with hypoxemia ([place])   Fever   Pneumonia due to infectious organism   . heparin  5,000 Units Subcutaneous Q8H    SUBJECTIVE: Feeling nauseated today. Was able to sleep on the non-rebreather mask last night. No fevers or chills over night and overall describes to be feeling "less sick" than when she came in.   Allergies  Allergen Reactions  . Meperidine And Related Hives    OBJECTIVE: Vitals:   04/09/18 0737 04/09/18 0800 04/09/18 0900 04/09/18 0904  BP:  104/69 114/78   Pulse:  (!) 112 (!) 108 (!) 112  Resp:   20 (!) 30 (!) 26  Temp: 98.5 F (36.9 C)     TempSrc: Axillary     SpO2:  96% 95% 96%  Weight:      Height:       Body mass index is 29.83 kg/m.  Physical Exam  Constitutional: She is oriented to person, place, and time.  Resting in bed with NRB mask. Appears fatigued.   HENT:  Mouth/Throat: Oropharynx is clear and moist.  Eyes: Pupils are equal, round, and reactive to light. No scleral icterus.  Cardiovascular: Regular rhythm and normal heart sounds. Tachycardia present.  No murmur heard. Pulmonary/Chest: No accessory muscle usage. Tachypnea noted. No respiratory distress. She has decreased breath sounds. She has rales in the right upper field, the right middle field, the left upper field and the left middle field.  Right chest port accessed. Clean/dry dressing.   Abdominal: Soft. There is no tenderness.  Musculoskeletal: She exhibits edema (generalized).  Neurological: She is alert and oriented to person, place, and time.  Skin: Skin is warm and dry. No rash noted.   Lab Results Lab Results  Component Value Date   WBC 43.3 (H) 04/09/2018   HGB 9.3 (L) 04/09/2018   HCT 29.5 (L) 04/09/2018   MCV 98.0 04/09/2018   PLT 257 04/09/2018    Lab Results  Component Value Date   CREATININE 0.67 04/09/2018   BUN 6 04/09/2018   NA 140 04/09/2018   K 4.8 04/09/2018   CL  107 04/09/2018   CO2 26 04/09/2018    Lab Results  Component Value Date   ALT 20 04/08/2018   AST 37 04/08/2018   ALKPHOS 119 04/08/2018   BILITOT 0.2 (L) 04/08/2018     Microbiology: Blood Cx 7/9 >> NG <24h Urine Cx 7/9 >>  Sputum Cx 7/9 >>   [employer] [phone]) Strep A screen - neg  Blood Culture - No growth from either blood cultures @ 48h Resp Cx - normal respiratory flora  MRSA PCR - neg  PJP screen - never collected  RVP - pending (04/10/18   Janene Madeira, MSN, NP-C [facility] for Infectious [place] Cell: [phone] Pager:  [phone]  04/09/2018  10:04 AM

## 2018-04-09 NOTE — Progress Notes (Addendum)
[facility] Progress Note Patient Name: Kelly Hubbard DOB: 10-Aug-1986 MRN: [id number]   Date of Service  04/09/2018  HPI/Events of Note  Cancer related pain not controlled by oxycodone. No contraindication to multi-modal pain Rx with Oxycodone + Toradol + Ofirmev. Patient has nausea not fully controlled by Zofran. Second issue is resp difficulty on Non-rebreather mask and bilateral lung infiltrates that are due to pneumonia with possible hydrostatic component. Reasonable to diurese gently to see if it improves oxygenation.  eICU Interventions  Toradol 7.5 mg iv q 6 hrs prn, Ofirmev 1000 mg iv q 6 hrs prn x 4 doses, lasix 20 mg iv x 1        [place] U Ogan 04/09/2018, 8:10 PM

## 2018-04-10 ENCOUNTER — Encounter (HOSPITAL_COMMUNITY): Payer: Self-pay

## 2018-04-10 DIAGNOSIS — Z9221 Personal history of antineoplastic chemotherapy: Secondary | ICD-10-CM

## 2018-04-10 DIAGNOSIS — J168 Pneumonia due to other specified infectious organisms: Secondary | ICD-10-CM

## 2018-04-10 DIAGNOSIS — J181 Lobar pneumonia, unspecified organism: Secondary | ICD-10-CM

## 2018-04-10 LAB — BASIC METABOLIC PANEL
Anion gap: 12 (ref 5–15)
BUN: 8 mg/dL (ref 6–20)
CALCIUM: 8.5 mg/dL — AB (ref 8.9–10.3)
CHLORIDE: 100 mmol/L (ref 98–111)
CO2: 28 mmol/L (ref 22–32)
CREATININE: 0.61 mg/dL (ref 0.44–1.00)
GFR calc non Af Amer: >60 mL/min (ref >60)
Glucose, Bld: 105 mg/dL — ABNORMAL HIGH (ref 70–99)
Potassium: 3.9 mmol/L (ref 3.5–5.1)
Sodium: 140 mmol/L (ref 135–145)

## 2018-04-10 LAB — CBC
HEMATOCRIT: 28.9 % — AB (ref 36.0–46.0)
HEMOGLOBIN: 9.1 g/dL — AB (ref 12.0–15.0)
MCH: 30.3 pg (ref 26.0–34.0)
MCHC: 31.5 g/dL (ref 30.0–36.0)
MCV: 96.3 fL (ref 78.0–100.0)
Platelets: 262 10*3/uL (ref 150–400)
RBC: 3 MIL/uL — ABNORMAL LOW (ref 3.87–5.11)
RDW: 14.7 % (ref 11.5–15.5)
WBC: 30 10*3/uL — ABNORMAL HIGH (ref 4.0–10.5)

## 2018-04-10 LAB — URINE CULTURE: Culture: NO GROWTH

## 2018-04-10 MED ORDER — DEXTROMETHORPHAN POLISTIREX ER 30 MG/5ML PO SUER
30.0000 mg | Freq: Three times a day (TID) | ORAL | Status: DC
  Administered 2018-04-10 – 2018-04-15 (×14): 30 mg via ORAL
  Filled 2018-04-10 (×17): qty 5

## 2018-04-10 MED ORDER — LEVALBUTEROL HCL 0.63 MG/3ML IN NEBU
0.6300 mg | INHALATION_SOLUTION | RESPIRATORY_TRACT | Status: DC | PRN
  Administered 2018-04-10 – 2018-04-11 (×2): 0.63 mg via RESPIRATORY_TRACT
  Filled 2018-04-10 (×2): qty 3

## 2018-04-10 MED ORDER — LORAZEPAM 2 MG/ML IJ SOLN
1.0000 mg | Freq: Once | INTRAMUSCULAR | Status: AC
  Administered 2018-04-10: 1 mg via INTRAVENOUS

## 2018-04-10 MED ORDER — PROMETHAZINE HCL 25 MG/ML IJ SOLN
12.5000 mg | Freq: Four times a day (QID) | INTRAMUSCULAR | Status: DC | PRN
Start: 2018-04-10 — End: 2018-04-15
  Administered 2018-04-10 – 2018-04-14 (×9): 12.5 mg via INTRAVENOUS
  Filled 2018-04-10 (×10): qty 1

## 2018-04-10 MED ORDER — LORAZEPAM 2 MG/ML IJ SOLN
INTRAMUSCULAR | Status: AC
  Administered 2018-04-10: 1 mg via INTRAVENOUS
  Filled 2018-04-10: qty 1

## 2018-04-10 MED ORDER — BOOST / RESOURCE BREEZE PO LIQD CUSTOM
1.0000 | Freq: Three times a day (TID) | ORAL | Status: DC
  Administered 2018-04-10 – 2018-04-15 (×6): 1 via ORAL

## 2018-04-10 MED ORDER — FAMOTIDINE 20 MG PO TABS: 20.0000 mg | ORAL_TABLET | Freq: Two times a day (BID) | ORAL | Status: DC

## 2018-04-10 MED ORDER — FAMOTIDINE IN NACL 20-0.9 MG/50ML-% IV SOLN
20.0000 mg | Freq: Two times a day (BID) | INTRAVENOUS | Status: DC
  Administered 2018-04-10 – 2018-04-13 (×7): 20 mg via INTRAVENOUS
  Filled 2018-04-10 (×7): qty 50

## 2018-04-10 MED ORDER — OXYCODONE HCL 5 MG PO TABS
5.0000 mg | ORAL_TABLET | ORAL | Status: DC | PRN
  Administered 2018-04-11 – 2018-04-15 (×14): 10 mg via ORAL
  Filled 2018-04-10 (×8): qty 2
  Filled 2018-04-10: qty 1
  Filled 2018-04-10 (×6): qty 2

## 2018-04-10 NOTE — Progress Notes (Signed)
Patient taken off of non-rebreather mask and placed on 14L salter high flow nasal cannula and is currently tolerating well.  RT will continue to monitor.

## 2018-04-10 NOTE — Progress Notes (Signed)
PULMONARY / CRITICAL CARE MEDICINE   Name: Kelly Hubbard MRN: [id number] DOB: 07/09/1986    ADMISSION DATE:  04/08/2018 CONSULTATION DATE:  04/08/2018  REFERRING MD:  Olga Coaster hospitalis  CHIEF COMPLAINT:  HCAP and Acute respiratory failure  HISTORY OF PRESENT ILLNESS:   32 year old female with recent diagnosis (01/2018) of stage IV breast cancer with mets to the lungs HER2 positive that is on 2 biologics, no RT or surgical interventions.  Patient is on a BiPAP and history is very hard to obtain.  From records, presents to [place] with SOB and was noted to have multiple opacities on chest CT with worsening infiltrate.  Patient was transferred to the ICU in [place] and continued to deteriorate. BiPAP was started and PCCM was asked to accept on transfer on 7/9.    SUBJECTIVE:  Feeling better today, no acute changes overnight. Able to sleep   VITAL SIGNS: BP 133/74   Pulse (!) 117   Temp 98.5 F (36.9 C) (Oral)   Resp (!) 34   Ht 4' 11"  (1.499 m)   Wt 147 lb 11.3 oz (67 kg)   LMP 04/08/2018 (Exact Date)   SpO2 99%   BMI 29.83 kg/m   HEMODYNAMICS: 96% on NRB at 15L  VENTILATOR SETTINGS: FiO2 (%):  [100 %] 100 %  INTAKE / OUTPUT: I/O last 3 completed shifts: In: 3231.8 [I.V.:1748.4; IV Piggyback:1483.4] Out: 2550 [Urine:2550]  PHYSICAL EXAMINATION: General: NAD, pleasant HEENT: PERRL.  Cardiovascular: RRR, no m/r/g, no LE edema Respiratory: coarse breath sounds with rales noted in bases, normal work of breathing on NRB Gastrointestinal: soft, nontender, nondistended Extremities: warm, dry Derm: no rashes appreciated Neuro: alert and interactive Psych: Appropriate affect  LABS:  BMET Recent Labs  Lab 04/08/18 1410 04/09/18 0308 04/10/18 0429  NA 138 140 140  K 4.5 4.8 3.9  CL 106 107 100  CO2 23 26 28   BUN <5* 6 8  CREATININE 0.58 0.67 0.61  GLUCOSE 128* 108* 105*    Electrolytes Recent Labs  Lab 04/08/18 1410 04/09/18 0308  04/10/18 0429  CALCIUM 8.7* 8.5* 8.5*  MG 1.9 2.0  --   PHOS 1.8* 2.4*  --     CBC Recent Labs  Lab 04/08/18 1410 04/09/18 0308 04/10/18 0429  WBC 48.0* 43.3* 30.0*  HGB 10.4* 9.3* 9.1*  HCT 32.9* 29.5* 28.9*  PLT 263 257 262    Coag's Recent Labs  Lab 04/08/18 1410  APTT 33  INR 1.14    Sepsis Markers Recent Labs  Lab 04/08/18 1410 04/08/18 1743  LATICACIDVEN 1.4 1.0  PROCALCITON 0.19  --     ABG No results for input(s): PHART, PCO2ART, PO2ART in the last 168 hours.  Liver Enzymes Recent Labs  Lab 04/08/18 1410  AST 37  ALT 20  ALKPHOS 119  BILITOT 0.2*  ALBUMIN 2.8*    Cardiac Enzymes No results for input(s): TROPONINI, PROBNP in the last 168 hours.  Glucose Recent Labs  Lab 04/08/18 1324  GLUCAP 131*    Imaging  STUDIES:  CT chest, abdomen, pelvis 04/06/18 ([place])>>>  Interval development of patchy bilateral airspace disease more confluent airspace consolidation in the lower lobes. Multifocal infection is considered likely. Metastatic disease is possible but considered less likely. Interval progression of bilateral pulmonary nodules consistent with metastatic disease.  Interval decrease in left supraclavicular, left axillary, and mediastinal lymphadenopathy; although a low right hilar lymph node is new in the interval.  Bilateral cystic lesions in the ovaries-  likely physiologic but attention on follow-up imaging recommended.  Large left breast mass again noted.  CULTURES: Blood 7/9>>> Urine 7/9>>> Sputum 7/9>>> RVP [name]) 7/8>>> pending  ANTIBIOTICS: Cefepime 7/9>>> Vancomycin 7/9>>>7/10  SIGNIFICANT EVENTS:  7/9>>>transfer to [facility]ICU for respiratory failure from HCAP  LINES/TUBES: R [place]  DISCUSSION: 32 year old female with stage 4 breast cancer presenting to PCCM with respiratory failure from HCAP and lung mets from breast cancer.  Stable for stepdown on 7/11.  ASSESSMENT / PLAN:  PULMONARY A: Acute respiratory  failure HCAP P:   NRB for work of breathing Continue abx as below May need consult for oncology for pulmonary mets- follow by [name] cancer enter  CARDIOVASCULAR A:  Tachycardia- stable P:  Continuous cardiac monitoring  RENAL A:   No active issues P:   Trend BMP / urinary output Replace electrolytes as indicated Avoid nephrotoxic agents, ensure adequate renal perfusion  GASTROINTESTINAL A:   No active issues P:   Pepcid for prophylaxis  HEMATOLOGIC A:   Leukocytosis P:  Continue abx  CBC Transfuse per ICU protocol  INFECTIOUS A:   HCAP P:   Continue cefepime only ID following F/u on cultures  ENDOCRINE A:   No active issues   P:   Monitor for any changes  NEUROLOGIC A:   No active issues P:   Avoid sedating medications  FAMILY  - Updates: Stable for stepdown.    - Inter-disciplinary family meet or Palliative Care meeting due by:  day 7  Martinique Linsay Vogt, DO PGY-2, [facility]  04/10/2018, 7:04 AM

## 2018-04-10 NOTE — Progress Notes (Signed)
Initial Nutrition Assessment  DOCUMENTATION CODES:   Not applicable  INTERVENTION:    Boost Breeze po TID, each supplement provides 250 kcal and 9 grams of protein  NUTRITION DIAGNOSIS:   Increased nutrient needs related to catabolic illness as evidenced by estimated needs  GOAL:   Patient will meet greater than or equal to 90% of their needs  MONITOR:   PO intake, Supplement acceptance, Labs, Weight trends, Skin, I & O's  REASON FOR ASSESSMENT:   Malnutrition Screening Tool  ASSESSMENT:   32 yo Female recently diagnosed 01/2018 with stage IV breast cancer metastatic to lung, received chemotherapy with Taxotere and anti-HER-2 drugs cancer center in [place] and admitted 7/9 with fevers and bilateral predominantly lower lobe consolidation noted on CT chest and abdomen.  RD spoke with patient. She is resting in her recliner. Family at bedside. She reports a poor appetite. Did not eat her breakfast this AM. Noted unopened Boost Breeze supplement on tray table.  Pt states she does like the [employer]. Labs and medications reviewed. Weight has been stable.  NUTRITION - FOCUSED PHYSICAL EXAM:  Completed. No muscle or fat loss noticed.  Diet Order:   Diet Order           Diet regular Room service appropriate? Yes; Fluid consistency: Thin  Diet effective now         EDUCATION NEEDS:   No education needs have been identified at this time  Skin:  Skin Assessment: Reviewed RN Assessment  Last BM:  7/10  Height:   Ht Readings from Last 1 Encounters:  04/08/18 4' 11"  (1.499 m)   Weight:   Wt Readings from Last 1 Encounters:  04/08/18 147 lb 11.3 oz (67 kg)   BMI:  Body mass index is 29.83 kg/m.  Estimated Nutritional Needs:   Kcal:  2000-2200  Protein:  100-115 gm  Fluid:  2.0-2.2 L  Arthur Holms, RD, LDN Pager #: [phone] After-Hours Pager #: [phone]

## 2018-04-10 NOTE — Progress Notes (Signed)
Pt's husband reported that pt was having trouble breathing and chest pain at 1500. RN assessed pt and gave oxycodone and Toradol. RT called. Dr ordered to place pt on BiPAP and give IV ativan. Pt improved and is now resting comfortably. Pt was sinus tach during episode, but vitals now within normal range. Pt's husband very upset that pt was not receiving home prescription of pain meds. Dr paged, frequency and dosing of oxycodone was increased to match home prescription. RN spoke with husband and answered questions. Husband requested that we ask pt q4h if she would like pain meds. RN will pass along in report. Will continue to monitor.  Riley Kill RN

## 2018-04-10 NOTE — Progress Notes (Signed)
Patient ID: Kelly Hubbard, female   DOB: 08-Aug-1986, 32 y.o.   MRN: [id number]         [facility] for Infectious Disease  Date of Admission:  04/08/2018    Total days of antibiotics 5        Day 3 cefepime         ASSESSMENT: She is improving with time and empiric cefepime.  Sputum and blood cultures are negative for obvious pathogens.  She still has very prominent, diffuse crackles and not absolutely sure if this is infection or acute lung injury related to recent chemotherapy.  PLAN: 1. Continue cefepime  Active Problems:   Acute respiratory failure with hypoxemia (HCC)   Fever   Pneumonia due to infectious organism   Scheduled Meds: . dextromethorphan  30 mg Oral TID  . heparin  5,000 Units Subcutaneous Q8H   Continuous Infusions: . sodium chloride 10 mL/hr at 04/10/18 0900  . acetaminophen Stopped (04/10/18 0238)  . ceFEPime (MAXIPIME) IV Stopped (04/10/18 4332)  . famotidine (PEPCID) IV 20 mg (04/10/18 1013)   PRN Meds:.sodium chloride, acetaminophen, ketorolac, levalbuterol, ondansetron (ZOFRAN) IV, oxyCODONE, promethazine   SUBJECTIVE: She is feeling better today.  She continues to be bothered by dry cough but is feeling less short of breath.  Review of Systems: Review of Systems  Constitutional: Negative for chills, diaphoresis and fever.  Respiratory: Positive for cough and shortness of breath. Negative for sputum production.   Gastrointestinal: Negative for abdominal pain, diarrhea, nausea and vomiting.    Allergies  Allergen Reactions  . Meperidine And Related Hives    OBJECTIVE: Vitals:   04/10/18 0930 04/10/18 0945 04/10/18 1000 04/10/18 1057  BP:   113/74 115/75  Pulse: 96 90 (!) 112 98  Resp: (!) 25 (!) 23 (!) 35   Temp:    98.3 F (36.8 C)  TempSrc:    Oral  SpO2: 90% 91% 91% 98%  Weight:      Height:       Body mass index is 29.83 kg/m.  Physical Exam  Constitutional:  She looks like she is feeling better.  She is  sitting up in a chair.  Cardiovascular: Normal rate, regular rhythm and normal heart sounds.  No murmur heard. Pulmonary/Chest: Effort normal. She has rales.    Lab Results Lab Results  Component Value Date   WBC 30.0 (H) 04/10/2018   HGB 9.1 (L) 04/10/2018   HCT 28.9 (L) 04/10/2018   MCV 96.3 04/10/2018   PLT 262 04/10/2018    Lab Results  Component Value Date   CREATININE 0.61 04/10/2018   BUN 8 04/10/2018   NA 140 04/10/2018   K 3.9 04/10/2018   CL 100 04/10/2018   CO2 28 04/10/2018    Lab Results  Component Value Date   ALT 20 04/08/2018   AST 37 04/08/2018   ALKPHOS 119 04/08/2018   BILITOT 0.2 (L) 04/08/2018     Microbiology: Recent Results (from the past 240 hour(s))  MRSA PCR Screening     Status: None   Collection Time: 04/08/18  1:20 PM  Result Value Ref Range Status   MRSA by PCR NEGATIVE NEGATIVE Final    Comment:        The GeneXpert MRSA Assay (FDA approved for NASAL specimens only), is one component of a comprehensive MRSA colonization surveillance program. It is not intended to diagnose MRSA infection nor to guide or monitor treatment for MRSA infections. Performed at [facility],  [address] [place], [place] [address]   Culture, blood (routine x 2)     Status: None (Preliminary result)   Collection Time: 04/08/18  2:00 PM  Result Value Ref Range Status   Specimen Description BLOOD RIGHT ANTECUBITAL  Final   Special Requests   Final    BOTTLES DRAWN AEROBIC ONLY Blood Culture results may not be optimal due to an inadequate volume of blood received in culture bottles   Culture   Final    NO GROWTH 2 DAYS Performed at [facility], [place] [address] [place], [place] [address]    Report Status PENDING  Incomplete  Culture, blood (routine x 2)     Status: None (Preliminary result)   Collection Time: 04/08/18  2:13 PM  Result Value Ref Range Status   Specimen Description BLOOD RIGHT ARM  Final   Special Requests   Final     BOTTLES DRAWN AEROBIC ONLY Blood Culture adequate volume   Culture   Final    NO GROWTH 2 DAYS Performed at [facility], [address] [place], [place] [address]    Report Status PENDING  Incomplete  Urine culture     Status: None   Collection Time: 04/09/18 12:51 AM  Result Value Ref Range Status   Specimen Description URINE, RANDOM  Final   Special Requests NONE  Final   Culture   Final    NO GROWTH Performed at [facility], [address] [place], [place] [address]    Report Status 04/10/2018 FINAL  Final    Michel Bickers, MD [place] [facility] [place] [phone] pager   [phone] cell 04/10/2018, 12:38 PM

## 2018-04-10 NOTE — Progress Notes (Signed)
Report called to 2W 21 RN, pt tx to 2W 21 w/ vss, on NRB for tx back to [facility] on arrival with stable resp status, recvd by RN and NT. Report confirmed at bs. Husband called and informed of tx before tx.

## 2018-04-10 NOTE — Progress Notes (Signed)
32 year old recently diagnosed 01/2018 with stage IV breast cancer metastatic to lung, received chemotherapy with Taxotere and anti-HER-2 drugs cancer center in [place] and admitted 7/9 with fevers and bilateral predominantly lower lobe consolidation noted on CT chest and abdomen She required BiPAP transiently and has transitioned to nonrebreather   On exam-awake and alert, no accessory muscle usage able to speak in full sentences, bilateral crackles infrascapular, no rhonchi, no edema no JVD, S1-S2 normal.  Chest x-ray personally reviewed which shows bilateral predominant lower lobe airspace disease.  Labs show normal electrolytes, WBC count has gone down from 48 to 30  Impression/plan  Acute hypoxic respiratory failure-continue nonrebreather, she did not tolerate high flow oxygen, dial down FiO2 as her oxygenation improves, aim for saturation 92% and above.  Community-acquired pneumonia in immunocompromised patient versus acute pneumonitis from chemotherapy-continue empiric antibiotics cultures so far negative Urine strep antigen was negative Low procalcitonin seems to suggest pneumonitis, if her improvement plateaus, then may use empiric steroids  Breast cancer with mets-Per oncology. Acute pain-good response to Toradol and will use this instead of narcotics.  Okay to transfer to stepdown unit and to triad 7/12  Kara Mead MD. [facility]. [facility] Pager [phone] If no response call [phone]   04/10/2018

## 2018-04-11 ENCOUNTER — Inpatient Hospital Stay (HOSPITAL_COMMUNITY): Payer: BLUE CROSS/BLUE SHIELD

## 2018-04-11 DIAGNOSIS — Z880 Allergy status to penicillin: Secondary | ICD-10-CM

## 2018-04-11 DIAGNOSIS — R0989 Other specified symptoms and signs involving the circulatory and respiratory systems: Secondary | ICD-10-CM

## 2018-04-11 DIAGNOSIS — R197 Diarrhea, unspecified: Secondary | ICD-10-CM

## 2018-04-11 DIAGNOSIS — J9601 Acute respiratory failure with hypoxia: Secondary | ICD-10-CM

## 2018-04-11 LAB — BASIC METABOLIC PANEL
Anion gap: 10 (ref 5–15)
BUN: 11 mg/dL (ref 6–20)
CALCIUM: 8.2 mg/dL — AB (ref 8.9–10.3)
CO2: 35 mmol/L — ABNORMAL HIGH (ref 22–32)
Chloride: 97 mmol/L — ABNORMAL LOW (ref 98–111)
Creatinine, Ser: 0.56 mg/dL (ref 0.44–1.00)
Glucose, Bld: 203 mg/dL — ABNORMAL HIGH (ref 70–99)
Potassium: 3 mmol/L — ABNORMAL LOW (ref 3.5–5.1)
SODIUM: 142 mmol/L (ref 135–145)

## 2018-04-11 LAB — ECHOCARDIOGRAM COMPLETE
Height: 59 in
WEIGHTICAEL: 2345.69 [oz_av]

## 2018-04-11 LAB — MAGNESIUM: MAGNESIUM: 1.8 mg/dL (ref 1.7–2.4)

## 2018-04-11 MED ORDER — BUDESONIDE 0.25 MG/2ML IN SUSP: 0.2500 mg | Freq: Two times a day (BID) | RESPIRATORY_TRACT | Status: DC

## 2018-04-11 MED ORDER — FUROSEMIDE 10 MG/ML IJ SOLN
40.0000 mg | Freq: Four times a day (QID) | INTRAMUSCULAR | Status: AC
  Administered 2018-04-11 – 2018-04-12 (×4): 40 mg via INTRAVENOUS
  Filled 2018-04-11 (×4): qty 4

## 2018-04-11 MED ORDER — MORPHINE SULFATE (CONCENTRATE) 10 MG/0.5ML PO SOLN: 2.5000 mg | ORAL | Status: DC | PRN

## 2018-04-11 MED ORDER — SACCHAROMYCES BOULARDII 250 MG PO CAPS
250.0000 mg | ORAL_CAPSULE | Freq: Two times a day (BID) | ORAL | Status: DC
  Administered 2018-04-12 – 2018-04-15 (×8): 250 mg via ORAL
  Filled 2018-04-11 (×8): qty 1

## 2018-04-11 MED ORDER — METHYLPREDNISOLONE SODIUM SUCC 125 MG IJ SOLR
80.0000 mg | Freq: Three times a day (TID) | INTRAMUSCULAR | Status: DC
  Administered 2018-04-11 – 2018-04-14 (×10): 80 mg via INTRAVENOUS
  Filled 2018-04-11 (×10): qty 2

## 2018-04-11 MED ORDER — CLONAZEPAM 0.25 MG PO TBDP
0.2500 mg | ORAL_TABLET | Freq: Two times a day (BID) | ORAL | Status: DC | PRN
  Administered 2018-04-11: 0.25 mg via ORAL
  Filled 2018-04-11: qty 1

## 2018-04-11 MED ORDER — ACETAMINOPHEN 325 MG PO TABS
650.0000 mg | ORAL_TABLET | Freq: Four times a day (QID) | ORAL | Status: DC | PRN
  Administered 2018-04-12: 650 mg via ORAL
  Filled 2018-04-11: qty 2

## 2018-04-11 MED ORDER — CHOLESTYRAMINE 4 G PO PACK
4.0000 g | PACK | Freq: Two times a day (BID) | ORAL | Status: DC
  Filled 2018-04-11: qty 1

## 2018-04-11 MED ORDER — BUDESONIDE 0.25 MG/2ML IN SUSP
0.2500 mg | Freq: Two times a day (BID) | RESPIRATORY_TRACT | Status: DC
  Administered 2018-04-11 – 2018-04-15 (×8): 0.25 mg via RESPIRATORY_TRACT
  Filled 2018-04-11 (×8): qty 2

## 2018-04-11 MED ORDER — BENZONATATE 100 MG PO CAPS
100.0000 mg | ORAL_CAPSULE | Freq: Three times a day (TID) | ORAL | Status: DC
  Administered 2018-04-11 – 2018-04-15 (×12): 100 mg via ORAL
  Filled 2018-04-11 (×12): qty 1

## 2018-04-11 MED ORDER — FENTANYL CITRATE (PF) 100 MCG/2ML IJ SOLN
50.0000 ug | Freq: Once | INTRAMUSCULAR | Status: AC
  Administered 2018-04-11: 50 ug via INTRAVENOUS
  Filled 2018-04-11: qty 2

## 2018-04-11 MED ORDER — METHYLPREDNISOLONE SODIUM SUCC 40 MG IJ SOLR: 40.0000 mg | Freq: Four times a day (QID) | INTRAMUSCULAR | Status: DC

## 2018-04-11 MED ORDER — IPRATROPIUM-ALBUTEROL 0.5-2.5 (3) MG/3ML IN SOLN
3.0000 mL | Freq: Four times a day (QID) | RESPIRATORY_TRACT | Status: DC
  Administered 2018-04-11 – 2018-04-15 (×15): 3 mL via RESPIRATORY_TRACT
  Filled 2018-04-11 (×14): qty 3

## 2018-04-11 MED ORDER — FUROSEMIDE 10 MG/ML IJ SOLN: 60.0000 mg | Freq: Four times a day (QID) | INTRAMUSCULAR | Status: DC

## 2018-04-11 MED ORDER — MENTHOL 3 MG MT LOZG: 1.0000 | LOZENGE | OROMUCOSAL | Status: DC | PRN

## 2018-04-11 MED ORDER — GUAIFENESIN ER 600 MG PO TB12
600.0000 mg | ORAL_TABLET | Freq: Two times a day (BID) | ORAL | Status: DC
  Administered 2018-04-11 – 2018-04-15 (×9): 600 mg via ORAL
  Filled 2018-04-11 (×9): qty 1

## 2018-04-11 MED ORDER — ENOXAPARIN SODIUM 40 MG/0.4ML ~~LOC~~ SOLN
40.0000 mg | SUBCUTANEOUS | Status: DC
  Administered 2018-04-11 – 2018-04-14 (×4): 40 mg via SUBCUTANEOUS
  Filled 2018-04-11 (×4): qty 0.4

## 2018-04-11 NOTE — Progress Notes (Signed)
Brief 32 year old recently diagnosed 01/2018 with stage IV breast cancer metastatic to lung, received chemotherapy with Taxotere and anti-HER-2 drugs cancer center in [place] and admitted 7/9 with fevers and bilateral predominantly lower lobe consolidation noted on CT chest and abdomen She required BiPAP transiently and has transitioned to nonrebreather   Interval  7/11 moved to stepdown unit, on nonrebreather seem to improve with antibiotics 7/12 clinically worsened over the night.  Having chest pain and worsening shortness of breath placed back on noninvasive positive pressure ventilation  Tests/diagnostics: Echocardiogram 7/12>>>  Culture data Blood cultures times two 7/9 >>> Urine culture 7/10: Negative Urine strep antigen 7/9 negative Urine Legionella antigen 7/12>>> HIV antibody: 7/9: Negative Respiratory viral panel 7/12>>>  Antibiotics Cefepime 7/9 Vancomycin x1 dose 7/9  Subjective Feels a little better on BiPAP.  Also just received some oxycodone this also appeared to help.  She currently denies discomfort says she feels a little bit better.  Objective Blood Pressure 118/77   Pulse 97   Temperature 98.8 F (37.1 C) (Axillary)   Respiration (Abnormal) 24   Height 4' 11"  (1.499 m)   Weight 146 lb 9.7 oz (66.5 kg)   Last Menstrual Period 04/08/2018 (Exact Date)   Oxygen Saturation 93%   Body Mass Index 29.61 kg/m '  Intake/Output Summary (Last 24 hours) at 04/11/2018 1537 Last data filed at 04/11/2018 1500 Gross per 24 hour  Intake 1678.33 ml  Output 200 ml  Net 1478.33 ml    Physical exam General: 32 year old female patient currently on noninvasive positive pressure ventilation HEENT her head is shaved.  Otherwise normocephalic, BiPAP mask in place.  No jugular venous distention. Pulmonary: Scattered rales, equal chest rise, no chest pain currently. Cardiac: Tachycardic regular rate and rhythm no murmur rub or gallop  abdomen: Soft nontender no  organomegaly Extremities: Brisk capillary refill no significant edema warm, strong pulses.   Neuro: Awake oriented no focal deficits moves all extremities  CBC Recent Labs    04/09/18 0308 04/10/18 0429  WBC 43.3* 30.0*  HGB 9.3* 9.1*  HCT 29.5* 28.9*  PLT 257 262    Coag's No results for input(s): APTT, INR in the last 72 hours.  BMET Recent Labs    04/09/18 0308 04/10/18 0429  NA 140 140  K 4.8 3.9  CL 107 100  CO2 26 28  BUN 6 8  CREATININE 0.67 0.61  GLUCOSE 108* 105*    Electrolytes Recent Labs    04/09/18 0308 04/10/18 0429  CALCIUM 8.5* 8.5*  MG 2.0  --   PHOS 2.4*  --     Sepsis Markers No results for input(s): PROCALCITON, O2SATVEN in the last 72 hours.  Invalid input(s): LACTICACIDVEN  ABG No results for input(s): PHART, PCO2ART, PO2ART in the last 72 hours.  Liver Enzymes No results for input(s): AST, ALT, ALKPHOS, BILITOT, ALBUMIN in the last 72 hours.  Cardiac Enzymes No results for input(s): TROPONINI, PROBNP in the last 72 hours.  Glucose No results for input(s): GLUCAP in the last 72 hours.  Imaging Dg Chest Port 1 View  Result Date: 04/11/2018 CLINICAL DATA:  Shortness of breath.  Cough. EXAM: PORTABLE CHEST 1 VIEW COMPARISON:  Chest radiograph 04/08/2018. FINDINGS: Right anterior chest wall Port-A-Cath is present with tip projecting over the superior vena cava. Monitoring leads overlie the patient. Stable cardiac and mediastinal contours, largely obscured due to overlying opacities. Interval increase in diffuse bilateral consolidation. Probable small bilateral pleural effusions. No pneumothorax. Spinal fusion rods. IMPRESSION: Interval increase  in diffuse bilateral airspace opacities which may represent pneumonia or edema. Electronically Signed   By: Lovey Newcomer M.D.   On: 04/11/2018 08:54      Impression/plan  Acute hypoxic respiratory failure-in the setting of diffuse pulmonary infiltrates.  Differential diagnosis includes  community-acquired pneumonia with ARDS in immunocompromised host versus chemotherapy-induced pneumonitis versus pulmonary edema -She is nontoxic-appearing and procalcitonin is negative thus favoring drug-induced acute pneumonitis perhaps from her chemotherapy agent -Also need to consider potential viral pneumonitis versus pulmonary edema Plan/rec Continue current antibiotics, she is day #4 cefepime Check respiratory viral panel Agree with Lasix, good data suggest keeping her as dry as hemodynamically possible improves pulmonary outcomes Follow-up echocardiogram Empiric Solu-Medrol 80 mg q. 8.  Started today on 7/12 Follow-up chest x-ray Wean oxygen continue BiPAP as needed Low threshold to transfer back to intensive care especially if she requires escalating oxygen Toradol as needed, however suspect pain should improve with steroids   Breast cancer with mets Plan Follow-up with oncology   04/11/2018

## 2018-04-11 NOTE — Care Management Note (Addendum)
Case Management Note  Patient Details  Name: Kelly Hubbard MRN: [id number] Date of Birth: Feb 02, 1986  Subjective/Objective:    From home with spouse, presents with stage 4 breast ca with mets to lungs, acute resp failure, she is refusing surgery, on iv abx.  7/15 Tomi Bamberger RN, BSN - conts on iv steroids will taper and change to po, pt to see patient to get up oob,                  Action/Plan: NCM will follow for dc needs.   Expected Discharge Date:                  Expected Discharge Plan:  Home/Self Care  In-House Referral:     Discharge planning Services  CM Consult  Post Acute Care Choice:    Choice offered to:     DME Arranged:    DME Agency:     HH Arranged:    HH Agency:     Status of Service:  In process, will continue to follow  If discussed at Long Length of Stay Meetings, dates discussed:    Additional Comments:  Zenon Mayo, RN 04/11/2018, 10:22 AM

## 2018-04-11 NOTE — Progress Notes (Signed)
  Echocardiogram 2D Echocardiogram has been performed.  Madelaine Etienne 04/11/2018, 3:45 PM

## 2018-04-11 NOTE — Progress Notes (Signed)
Patient ID: Kelly Hubbard, female   DOB: [birth year], 32 y.o.   MRN: [id number]         [facility] for Infectious Disease  Date of Admission:  04/08/2018    Total days of antibiotics 6        Day 4 cefepime         ASSESSMENT: I agree that this may be acute pneumonitis and lung injury due to recent chemotherapy rather than community-acquired pneumonia.  She is currently undergoing an echocardiogram and just received first dose of Lasix and steroids.  I will continue cefepime for now and follow with you this weekend.  She has new onset diarrhea.  I will order a C. difficile screen.  PLAN: 1. Continue cefepime for now 2. C. difficile screen 3. Contact precautions  Principal Problem:   Pneumonitis Active Problems:   Acute respiratory failure with hypoxemia (HCC)   Fever   Diarrhea   Scheduled Meds: . benzonatate  100 mg Oral TID  . budesonide (PULMICORT) nebulizer solution  0.25 mg Nebulization BID  . dextromethorphan  30 mg Oral TID  . enoxaparin (LOVENOX) injection  40 mg Subcutaneous Q24H  . feeding supplement  1 Container Oral TID BM  . furosemide  40 mg Intravenous Q6H  . guaiFENesin  600 mg Oral BID  . ipratropium-albuterol  3 mL Nebulization Q6H  . methylPREDNISolone (SOLU-MEDROL) injection  80 mg Intravenous Q8H   Continuous Infusions: . sodium chloride 10 mL/hr at 04/10/18 1700  . ceFEPime (MAXIPIME) IV 2 g (04/11/18 1408)  . famotidine (PEPCID) IV 20 mg (04/11/18 0919)   PRN Meds:.sodium chloride, acetaminophen, clonazePAM, levalbuterol, menthol-cetylpyridinium, morphine CONCENTRATE, ondansetron (ZOFRAN) IV, oxyCODONE, promethazine   SUBJECTIVE: She was more short of breath overnight and used BiPAP.  She is on high flow nasal cannula oxygen now and is feeling a little bit better.  Her pain is under reasonably good control.  Review of Systems: Review of Systems  Constitutional: Negative for chills, diaphoresis and fever.  Respiratory:  Positive for cough and shortness of breath. Negative for sputum production and wheezing.   Gastrointestinal: Positive for diarrhea. Negative for abdominal pain, nausea and vomiting.    Allergies  Allergen Reactions  . Penicillins Shortness Of Breath, Palpitations and Other (See Comments)    Husband reports pt had muscle spasms, SOB, and palpitations  . Meperidine And Related Hives    OBJECTIVE: Vitals:   04/11/18 0726 04/11/18 0803 04/11/18 1145 04/11/18 1153  BP: 120/85 120/85 118/77 118/77  Pulse: 97 (!) 105 98 97  Resp: (!) 23 (!) 32 (!) 29 (!) 24  Temp: 98.6 F (37 C)  98.8 F (37.1 C)   TempSrc: Axillary  Axillary   SpO2: 95%  94% 93%  Weight:      Height:       Body mass index is 29.61 kg/m.  Physical Exam  Constitutional:  She is resting quietly in bed.  Cardiovascular: Normal rate, regular rhythm and normal heart sounds.  No murmur heard. Pulmonary/Chest: Effort normal. She has rales.    Lab Results Lab Results  Component Value Date   WBC 30.0 (H) 04/10/2018   HGB 9.1 (L) 04/10/2018   HCT 28.9 (L) 04/10/2018   MCV 96.3 04/10/2018   PLT 262 04/10/2018    Lab Results  Component Value Date   CREATININE 0.61 04/10/2018   BUN 8 04/10/2018   NA 140 04/10/2018   K 3.9 04/10/2018   CL 100 04/10/2018   CO2 28  04/10/2018    Lab Results  Component Value Date   ALT 20 04/08/2018   AST 37 04/08/2018   ALKPHOS 119 04/08/2018   BILITOT 0.2 (L) 04/08/2018     Microbiology: Recent Results (from the past 240 hour(s))  MRSA PCR Screening     Status: None   Collection Time: 04/08/18  1:20 PM  Result Value Ref Range Status   MRSA by PCR NEGATIVE NEGATIVE Final    Comment:        The GeneXpert MRSA Assay (FDA approved for NASAL specimens only), is one component of a comprehensive MRSA colonization surveillance program. It is not intended to diagnose MRSA infection nor to guide or monitor treatment for MRSA infections. Performed at [place], [place] [address] [place], [place] [address]   Culture, blood (routine x 2)     Status: None (Preliminary result)   Collection Time: 04/08/18  2:00 PM  Result Value Ref Range Status   Specimen Description BLOOD RIGHT ANTECUBITAL  Final   Special Requests   Final    BOTTLES DRAWN AEROBIC ONLY Blood Culture results may not be optimal due to an inadequate volume of blood received in culture bottles   Culture   Final    NO GROWTH 3 DAYS Performed at [facility], [place] [address], [place], [place] [address]    Report Status PENDING  Incomplete  Culture, blood (routine x 2)     Status: None (Preliminary result)   Collection Time: 04/08/18  2:13 PM  Result Value Ref Range Status   Specimen Description BLOOD RIGHT ARM  Final   Special Requests   Final    BOTTLES DRAWN AEROBIC ONLY Blood Culture adequate volume   Culture   Final    NO GROWTH 3 DAYS Performed at [facility], [address] [place], [place] [address]    Report Status PENDING  Incomplete  Urine culture     Status: None   Collection Time: 04/09/18 12:51 AM  Result Value Ref Range Status   Specimen Description URINE, RANDOM  Final   Special Requests NONE  Final   Culture   Final    NO GROWTH Performed at [facility], [address], [place], [place] [address]    Report Status 04/10/2018 FINAL  Final    Michel Bickers, MD [place] [facility] [phone] pager   [phone] cell 04/11/2018, 3:01 PM

## 2018-04-11 NOTE — Progress Notes (Addendum)
[facility] Progress Note  Patient: Kelly Hubbard ZOX:[id number]   PCP: Patient, No Pcp Per DOB: Jul 16, 1986   DOA: 04/08/2018   DOS: 04/11/2018   Date of Service: the patient was seen and examined on 04/11/2018  Subjective: The patient continues to have shortness of breath and cough.  No fever no chills.  Unable to come off of the BiPAP for longer duration enough even for meals.  Denies any nausea or vomiting.  Reports diarrhea 3-4 bowel movements yesterday.  No abdominal pain.  Brief hospital course: Pt. 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 Currently further plan is to continue current management.  Assessment and Plan: 1.  Acute respiratory distress syndrome Suspected community-acquired pneumonia Suspected interstitial pneumonitis. BiPAP dependent. Acute possibly diastolic CHF.  Initially presented at [facility], treated with IV antibiotics continue to deteriorate, had a CT chest abdomen and pelvis, transferred to ICU on BiPAP.  IV Bactrim, antifungal medications were also added.  Due to worsening condition patient was transferred to [facility] for further evaluation. Since arrival patient has remained on BiPAP, ID was consulted and on IV cefepime.  Blood cultures are negative.  Procalcitonin minimally elevated. BNP 300. Patient is roughly 8 L positive, based on the chart review patient was 4 L positive at [place] on 4 L positive here. Definitely has improvement in her white count from 48,000 on arrival to 30,000. Patient was admitted to the ICU service and now transferred to  hospitalist service from 04/11/2018. Chest x-ray shows increased diffuse bilateral airspace opacities.  With this information I will place the patient on 40 every 6 hours x4 doses of Lasix IV.  Attempt to keep the patient on dry side, at least -1 L/day.  Discussed with pulmonary I highly feel that the patient may have suffered interstitial pneumonitis from Taxol therapy and recommended steroids.  They agreed and patient will be started on 80 mg 3 times daily steroids based on that recommendation.  Follow-up on progress.  Also check echocardiogram.  Patient is unable to tolerate time off of BiPAP secondary to severe anxiety per RN. We will add Klonopin and low-dose morphine for symptom control.  2.  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 Per patient she is not a surgical candidate and therefore she has decided not to go for any surgery for her breast. No radiation therapy listed in the chart as well. Started on trastuzumab, Pertuzumab, docetaxel and has undergone 2 cycles so far. Received Neulasta after her recent cycle. Follows up with Dr. Bobby Rumpf for her oncology care.  3.  Recent ectopic pregnancy. Outpatient follow-up with GYN.  4.  Anxiety and depression. Secondary to acute illness. PRN Klonopin.  5.  Pain control. Currently on oxycodone. Monitor.  She has used this medication outpatient.  Diet: regular diet DVT Prophylaxis: subcutaneous Heparin  Advance goals of care discussion: full code  Family Communication: no family was present at bedside, at the time of interview.   Disposition:  Discharge to be determined.  Consultants: PCCM primary admit Procedures: none  Antibiotics: Anti-infectives (From admission, onward)   Start     Dose/Rate Route Frequency Ordered Stop   04/08/18 1430  vancomycin (VANCOCIN) 1,250 mg in  sodium chloride 0.9 % 250 mL IVPB  Status:  Discontinued     1,250 mg 166.7 mL/hr over 90 Minutes Intravenous Every 8 hours  04/08/18 1405 04/09/18 1126   04/08/18 1415  ceFEPIme (MAXIPIME) 2 g in sodium chloride 0.9 % 100 mL IVPB     2 g 200 mL/hr over 30 Minutes Intravenous Every 8 hours 04/08/18 1405         Objective: Physical Exam: Vitals:   04/11/18 0726 04/11/18 0803 04/11/18 1145 04/11/18 1153  BP: 120/85 120/85 118/77 118/77  Pulse: 97 (!) 105 98 97  Resp: (!) 23 (!) 32 (!) 29 (!) 24  Temp: 98.6 F (37 C)  98.8 F (37.1 C)   TempSrc: Axillary  Axillary   SpO2: 95%  94% 93%  Weight:      Height:        Intake/Output Summary (Last 24 hours) at 04/11/2018 1441 Last data filed at 04/11/2018 0919 Gross per 24 hour  Intake 1458.33 ml  Output 200 ml  Net 1258.33 ml   Filed Weights   04/08/18 1459 04/11/18 0401  Weight: 67 kg (147 lb 11.3 oz) 66.5 kg (146 lb 9.7 oz)   General: Alert, Awake and Oriented to Time, Place and Person. Appear in marked distress, affect flat Eyes: PERRL, Conjunctiva normal ENT: Oral Mucosa clear moist. Right angle of mouth cold sore Neck: positive JVD, no Abnormal Mass Or lumps Cardiovascular: S1 and S2 Present, no Murmur, Peripheral Pulses Present Respiratory: increased respiratory effort, Bilateral Air entry equal and Decreased, no use of accessory muscle, bilateral  Crackles, bilateral  wheezes Abdomen: Bowel Sound present, Soft and no tenderness, no hernia Skin: no redness, no Rash, no induration Extremities: no Pedal edema, no calf tenderness Neurologic: Grossly no focal neuro deficit. Bilaterally Equal motor strength  Data Reviewed: CBC: Recent Labs  Lab 04/08/18 1410 04/09/18 0308 04/10/18 0429  WBC 48.0* 43.3* 30.0*  HGB 10.4* 9.3* 9.1*  HCT 32.9* 29.5* 28.9*  MCV 96.5 98.0 96.3  PLT 263 257 825   Basic Metabolic Panel: Recent Labs  Lab 04/08/18 1410 04/09/18 0308 04/10/18 0429  NA 138 140 140  K 4.5 4.8 3.9  CL 106 107 100  CO2 23 26 28   GLUCOSE 128* 108* 105*  BUN <5* 6 8  CREATININE 0.58 0.67 0.61  CALCIUM 8.7* 8.5* 8.5*  MG 1.9 2.0   --   PHOS 1.8* 2.4*  --     Liver Function Tests: Recent Labs  Lab 04/08/18 1410  AST 37  ALT 20  ALKPHOS 119  BILITOT 0.2*  PROT 6.4*  ALBUMIN 2.8*   No results for input(s): LIPASE, AMYLASE in the last 168 hours. No results for input(s): AMMONIA in the last 168 hours. Coagulation Profile: Recent Labs  Lab 04/08/18 1410  INR 1.14   Cardiac Enzymes: No results for input(s): CKTOTAL, CKMB, CKMBINDEX, TROPONINI in the last 168 hours. BNP (last 3 results) No results for input(s): PROBNP in the last 8760 hours. CBG: Recent Labs  Lab 04/08/18 1324  GLUCAP 131*   Studies: Dg Chest Port 1 View  Result Date: 04/11/2018 CLINICAL DATA:  Shortness of breath.  Cough. EXAM: PORTABLE CHEST 1 VIEW COMPARISON:  Chest radiograph 04/08/2018. FINDINGS: Right anterior chest wall Port-A-Cath is present with tip projecting over the superior vena cava. Monitoring leads overlie the patient. Stable cardiac and mediastinal contours, largely obscured due to overlying opacities. Interval increase in diffuse bilateral consolidation. Probable small bilateral pleural effusions. No pneumothorax. Spinal fusion  rods. IMPRESSION: Interval increase in diffuse bilateral airspace opacities which may represent pneumonia or edema. Electronically Signed   By: Lovey Newcomer M.D.   On: 04/11/2018 08:54    Scheduled Meds: . benzonatate  100 mg Oral TID  . budesonide (PULMICORT) nebulizer solution  0.25 mg Nebulization BID  . dextromethorphan  30 mg Oral TID  . enoxaparin (LOVENOX) injection  40 mg Subcutaneous Q24H  . feeding supplement  1 Container Oral TID BM  . furosemide  40 mg Intravenous Q6H  . guaiFENesin  600 mg Oral BID  . ipratropium-albuterol  3 mL Nebulization Q6H  . methylPREDNISolone (SOLU-MEDROL) injection  80 mg Intravenous Q8H   Continuous Infusions: . sodium chloride 10 mL/hr at 04/10/18 1700  . ceFEPime (MAXIPIME) IV 2 g (04/11/18 1408)  . famotidine (PEPCID) IV 20 mg (04/11/18 0919)    PRN Meds: sodium chloride, acetaminophen, clonazePAM, levalbuterol, menthol-cetylpyridinium, morphine CONCENTRATE, ondansetron (ZOFRAN) IV, oxyCODONE, promethazine  Time spent: The patient is critically ill with multiple organ systems failure and requires high complexity decision making for assessment and support, frequent evaluation and titration of therapies. Critical Care Time devoted to patient care services described in this note is 35 minutes   Author: Berle Mull, MD [facility] Pager: [phone] 04/11/2018 2:41 PM  If 7PM-7AM, please contact night-coverage at [email or web address], password [facility]

## 2018-04-11 NOTE — Progress Notes (Signed)
Pharmacy Antibiotic Note  Kelly Hubbard is a 32 y.o. female admitted on 04/08/2018 with pneumonia.  Pharmacy has been consulted for cefepime dosing.   Day #4 of abx for HCAP here (day #7 total if includes ), WBC 43.3>30, no clear source, possible pna on CXR  Plan: Continue cefepime 2 gm IV Q 8 hours  Monitor CBC, renal fx, cultures and clinical progress F/U ID recs, LOT  Height: 4\' 11"  (149.9 cm) Weight: 146 lb 9.7 oz (66.5 kg) IBW/kg (Calculated) : 43.2  Temp (24hrs), Avg:98.9 F (37.2 C), Min:98.3 F (36.8 C), Max:99.6 F (37.6 C)  Recent Labs  Lab 04/08/18 1410 04/08/18 1743 04/09/18 0308 04/10/18 0429  WBC 48.0*  --  43.3* 30.0*  CREATININE 0.58  --  0.67 0.61  LATICACIDVEN 1.4 1.0  --   --     Estimated Creatinine Clearance: 84.4 mL/min (by C-G formula based on SCr of 0.61 mg/dL).    Allergies  Allergen Reactions  . Penicillins Shortness Of Breath, Palpitations and Other (See Comments)    Husband reports pt had muscle spasms, SOB, and palpitations  . Meperidine And Related Hives    Thank you for allowing pharmacy to be a part of this patient's care.  Elenor Quinones, PharmD, BCPS Clinical Pharmacist Phone number [phone] 04/11/2018 8:10 AM

## 2018-04-12 ENCOUNTER — Inpatient Hospital Stay (HOSPITAL_COMMUNITY): Payer: BLUE CROSS/BLUE SHIELD

## 2018-04-12 ENCOUNTER — Other Ambulatory Visit (HOSPITAL_COMMUNITY): Payer: BLUE CROSS/BLUE SHIELD

## 2018-04-12 DIAGNOSIS — J9601 Acute respiratory failure with hypoxia: Secondary | ICD-10-CM

## 2018-04-12 LAB — COMPREHENSIVE METABOLIC PANEL
ALT: 20 U/L (ref 0–44)
AST: 21 U/L (ref 15–41)
Albumin: 2.4 g/dL — ABNORMAL LOW (ref 3.5–5.0)
Alkaline Phosphatase: 93 U/L (ref 38–126)
Anion gap: 11 (ref 5–15)
BILIRUBIN TOTAL: 0.4 mg/dL (ref 0.3–1.2)
BUN: 14 mg/dL (ref 6–20)
CALCIUM: 8.8 mg/dL — AB (ref 8.9–10.3)
CO2: 38 mmol/L — ABNORMAL HIGH (ref 22–32)
Chloride: 93 mmol/L — ABNORMAL LOW (ref 98–111)
Creatinine, Ser: 0.63 mg/dL (ref 0.44–1.00)
GFR calc Af Amer: >60 mL/min (ref >60)
GFR calc non Af Amer: >60 mL/min (ref >60)
GLUCOSE: 186 mg/dL — AB (ref 70–99)
Potassium: 3 mmol/L — ABNORMAL LOW (ref 3.5–5.1)
Sodium: 142 mmol/L (ref 135–145)
TOTAL PROTEIN: 6.4 g/dL — AB (ref 6.5–8.1)

## 2018-04-12 LAB — LEGIONELLA PNEUMOPHILA SEROGP 1 UR AG: L. PNEUMOPHILA SEROGP 1 UR AG: NEGATIVE

## 2018-04-12 LAB — RESPIRATORY PANEL BY PCR
Adenovirus: NOT DETECTED
BORDETELLA PERTUSSIS-RVPCR: NOT DETECTED
CHLAMYDOPHILA PNEUMONIAE-RVPPCR: NOT DETECTED
CORONAVIRUS 229E-RVPPCR: NOT DETECTED
Coronavirus HKU1: NOT DETECTED
Coronavirus NL63: NOT DETECTED
Coronavirus OC43: NOT DETECTED
Influenza A: NOT DETECTED
Influenza B: NOT DETECTED
Metapneumovirus: NOT DETECTED
Mycoplasma pneumoniae: NOT DETECTED
PARAINFLUENZA VIRUS 4-RVPPCR: NOT DETECTED
Parainfluenza Virus 1: NOT DETECTED
Parainfluenza Virus 2: NOT DETECTED
Parainfluenza Virus 3: NOT DETECTED
RESPIRATORY SYNCYTIAL VIRUS-RVPPCR: NOT DETECTED
Rhinovirus / Enterovirus: NOT DETECTED

## 2018-04-12 LAB — CBC WITH DIFFERENTIAL/PLATELET
BASOS PCT: 0 %
Basophils Absolute: 0 10*3/uL (ref 0.0–0.1)
EOS PCT: 0 %
Eosinophils Absolute: 0 10*3/uL (ref 0.0–0.7)
HEMATOCRIT: 31.1 % — AB (ref 36.0–46.0)
HEMOGLOBIN: 9.7 g/dL — AB (ref 12.0–15.0)
LYMPHS PCT: 9 %
Lymphs Abs: 1.6 10*3/uL (ref 0.7–4.0)
MCH: 29.9 pg (ref 26.0–34.0)
MCHC: 31.2 g/dL (ref 30.0–36.0)
MCV: 96 fL (ref 78.0–100.0)
Monocytes Absolute: 0.2 10*3/uL (ref 0.1–1.0)
Monocytes Relative: 1 %
NEUTROS PCT: 90 %
Neutro Abs: 15.6 10*3/uL — ABNORMAL HIGH (ref 1.7–7.7)
Platelets: 468 10*3/uL — ABNORMAL HIGH (ref 150–400)
RBC: 3.24 MIL/uL — ABNORMAL LOW (ref 3.87–5.11)
RDW: 14.2 % (ref 11.5–15.5)
WBC: 17.4 10*3/uL — ABNORMAL HIGH (ref 4.0–10.5)

## 2018-04-12 LAB — ECHOCARDIOGRAM LIMITED
Height: 59 in
Weight: 2186.96 oz

## 2018-04-12 LAB — MAGNESIUM: Magnesium: 2.1 mg/dL (ref 1.7–2.4)

## 2018-04-12 LAB — SEDIMENTATION RATE: Sed Rate: 131 mm/hr — ABNORMAL HIGH (ref 0–22)

## 2018-04-12 LAB — C-REACTIVE PROTEIN: CRP: 18.5 mg/dL — ABNORMAL HIGH (ref <1.0)

## 2018-04-12 MED ORDER — PERFLUTREN LIPID MICROSPHERE
1.0000 mL | INTRAVENOUS | Status: AC | PRN
  Administered 2018-04-12: 2 mL via INTRAVENOUS
  Filled 2018-04-12: qty 10

## 2018-04-12 MED ORDER — POTASSIUM CHLORIDE CRYS ER 20 MEQ PO TBCR
40.0000 meq | EXTENDED_RELEASE_TABLET | Freq: Two times a day (BID) | ORAL | Status: AC
  Administered 2018-04-12 (×2): 40 meq via ORAL
  Filled 2018-04-12 (×2): qty 2

## 2018-04-12 NOTE — Progress Notes (Signed)
Patient ID: Kelly Hubbard, female   DOB: [birth year], 32 y.o.   MRN: [id number]          [facility] for Infectious Disease  Date of Admission:  04/08/2018    Total days of antibiotics 7        Day 5 cefepime         ASSESSMENT: She has shown dramatic improvement over the past 24 hours, most likely due to a significant diuresis and steroids.  Her chest x-ray is much clearer and her oxygenation has improved.  PLAN: 1. Continue cefepime for 24 more hours 2. Discontinue enteric and droplet precautions  Principal Problem:   Pneumonitis Active Problems:   Acute respiratory failure with hypoxemia (HCC)   Fever   Scheduled Meds: . benzonatate  100 mg Oral TID  . budesonide (PULMICORT) nebulizer solution  0.25 mg Nebulization BID  . dextromethorphan  30 mg Oral TID  . enoxaparin (LOVENOX) injection  40 mg Subcutaneous Q24H  . feeding supplement  1 Container Oral TID BM  . guaiFENesin  600 mg Oral BID  . ipratropium-albuterol  3 mL Nebulization Q6H  . methylPREDNISolone (SOLU-MEDROL) injection  80 mg Intravenous Q8H  . potassium chloride  40 mEq Oral BID  . saccharomyces boulardii  250 mg Oral BID   Continuous Infusions: . sodium chloride 10 mL/hr at 04/10/18 1700  . ceFEPime (MAXIPIME) IV 2 g (04/12/18 1322)  . famotidine (PEPCID) IV Stopped (04/12/18 1321)   PRN Meds:.sodium chloride, acetaminophen, clonazePAM, levalbuterol, menthol-cetylpyridinium, morphine CONCENTRATE, ondansetron (ZOFRAN) IV, oxyCODONE, promethazine   SUBJECTIVE: She is feeling much better today.  She still has some dry cough but she is not has short of breath.  She has not had any diarrhea in the past 24 hours.  Review of Systems: Review of Systems  Constitutional: Negative for chills, diaphoresis and fever.  Respiratory: Positive for cough and shortness of breath. Negative for sputum production and wheezing.   Gastrointestinal: Negative for abdominal pain, diarrhea, nausea and  vomiting.    Allergies  Allergen Reactions  . Penicillins Shortness Of Breath, Palpitations and Other (See Comments)    Husband reports pt had muscle spasms, SOB, and palpitations  . Meperidine And Related Hives    OBJECTIVE: Vitals:   04/12/18 0600 04/12/18 0818 04/12/18 0850 04/12/18 1227  BP:   125/76 120/78  Pulse:      Resp:   20 18  Temp:      TempSrc:      SpO2:  98% 95% 97%  Weight: 136 lb 11 oz (62 kg)     Height:       Body mass index is 27.61 kg/m.  Physical Exam  Constitutional: She is oriented to person, place, and time.  She is smiling and in much better spirits today.  She has 2 L negative over the past 24 hours and her weight is down 11 pounds.  She is just below her usual weight.  Cardiovascular: Normal rate, regular rhythm and normal heart sounds.  No murmur heard. Pulmonary/Chest: Effort normal. She has rales.  Her bilateral crackles are not as prominent.  Neurological: She is alert and oriented to person, place, and time.  Skin: No rash noted.    Lab Results Lab Results  Component Value Date   WBC 17.4 (H) 04/12/2018   HGB 9.7 (L) 04/12/2018   HCT 31.1 (L) 04/12/2018   MCV 96.0 04/12/2018   PLT 468 (H) 04/12/2018    Lab Results  Component Value  Date   CREATININE 0.63 04/12/2018   BUN 14 04/12/2018   NA 142 04/12/2018   K 3.0 (L) 04/12/2018   CL 93 (L) 04/12/2018   CO2 38 (H) 04/12/2018    Lab Results  Component Value Date   ALT 20 04/12/2018   AST 21 04/12/2018   ALKPHOS 93 04/12/2018   BILITOT 0.4 04/12/2018    Respiratory virus panel: Negative  Microbiology: Recent Results (from the past 240 hour(s))  MRSA PCR Screening     Status: None   Collection Time: 04/08/18  1:20 PM  Result Value Ref Range Status   MRSA by PCR NEGATIVE NEGATIVE Final    Comment:        The GeneXpert MRSA Assay (FDA approved for NASAL specimens only), is one component of a comprehensive MRSA colonization surveillance program. It is not intended to  diagnose MRSA infection nor to guide or monitor treatment for MRSA infections. Performed at [facility], [place] [address] [place], [place] [address]   Culture, blood (routine x 2)     Status: None (Preliminary result)   Collection Time: 04/08/18  2:00 PM  Result Value Ref Range Status   Specimen Description BLOOD RIGHT ANTECUBITAL  Final   Special Requests   Final    BOTTLES DRAWN AEROBIC ONLY Blood Culture results may not be optimal due to an inadequate volume of blood received in culture bottles   Culture   Final    NO GROWTH 3 DAYS Performed at [facility], [place] [address], [place], [place] [address]    Report Status PENDING  Incomplete  Culture, blood (routine x 2)     Status: None (Preliminary result)   Collection Time: 04/08/18  2:13 PM  Result Value Ref Range Status   Specimen Description BLOOD RIGHT ARM  Final   Special Requests   Final    BOTTLES DRAWN AEROBIC ONLY Blood Culture adequate volume   Culture   Final    NO GROWTH 3 DAYS Performed at [facility], [address], [place], [place] [address]    Report Status PENDING  Incomplete  Urine culture     Status: None   Collection Time: 04/09/18 12:51 AM  Result Value Ref Range Status   Specimen Description URINE, RANDOM  Final   Special Requests NONE  Final   Culture   Final    NO GROWTH Performed at [facility], [address], [place], [place] [address]    Report Status 04/10/2018 FINAL  Final  Respiratory Panel by PCR     Status: None   Collection Time: 04/12/18  1:06 AM  Result Value Ref Range Status   Adenovirus NOT DETECTED NOT DETECTED Final   Coronavirus 229E NOT DETECTED NOT DETECTED Final   Coronavirus HKU1 NOT DETECTED NOT DETECTED Final   Coronavirus NL63 NOT DETECTED NOT DETECTED Final   Coronavirus OC43 NOT DETECTED NOT DETECTED Final   Metapneumovirus NOT DETECTED NOT DETECTED Final   Rhinovirus / Enterovirus NOT DETECTED NOT DETECTED Final   Influenza A NOT DETECTED  NOT DETECTED Final   Influenza B NOT DETECTED NOT DETECTED Final   Parainfluenza Virus 1 NOT DETECTED NOT DETECTED Final   Parainfluenza Virus 2 NOT DETECTED NOT DETECTED Final   Parainfluenza Virus 3 NOT DETECTED NOT DETECTED Final   Parainfluenza Virus 4 NOT DETECTED NOT DETECTED Final   Respiratory Syncytial Virus NOT DETECTED NOT DETECTED Final   Bordetella pertussis NOT DETECTED NOT DETECTED  Final   Chlamydophila pneumoniae NOT DETECTED NOT DETECTED Final   Mycoplasma pneumoniae NOT DETECTED NOT DETECTED Final    Comment: Performed at [facility], [place] [address], [place], [place] [address]    Michel Bickers, [place] [facility] [place] [facility] [phone] pager   [phone] cell 04/12/2018, 1:30 PM

## 2018-04-12 NOTE — Progress Notes (Signed)
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  Having chest pain and worsening shortness of breath placed back on noninvasive positive pressure ventilation. 7/13  Clinically improving, CXR with Improving bilateral diffuse pulmonary opacities, remains on HFNC at 12L with good saturations at rest  Tests/diagnostics: Echocardiogram 7/12>>>EF 55-60% Left ventricle: The cavity size was normal. Wall thickness was   normal. Systolic function was normal. The estimated ejection   fraction was in the range of 55% to 60%. - Left atrium: Echobright density behind posterior mitral leaflet.   Question if artifact Would recomm limited study with Definity to   assess further, possible CT vs TEE  Culture data Blood cultures times two 7/9 >>> Urine culture 7/10: Negative Urine strep antigen 7/9 negative Urine Legionella antigen 7/12>>> HIV antibody: 7/9: Negative Respiratory viral panel 7/12>>> Negative  Antibiotics Cefepime 7/9>> Vancomycin x1 dose 7/9  Subjective States she is much better after initiation of steroids. She is on HFNC. She is speaking in full sentences.  She currently denies discomfort says she feels a  better.She does have a non-productive cough.  Net negative last 1800 cc's last 24  Objective BP 125/76 (BP Location: Right Arm)   Pulse 81   Temp 99 F (37.2 C) (Axillary)   Resp 20   Ht 4' 11"  (1.499 m)   Wt 136 lb 11 oz (62 kg)   LMP 04/08/2018 (Exact Date)   SpO2 95%   BMI 27.61 kg/m '  Intake/Output Summary (Last 24 hours) at  04/12/2018 1152 Last data filed at 04/12/2018 1047 Gross per 24 hour  Intake 458 ml  Output 2700 ml  Net -2242 ml    Physical exam General: 32 year old female patient currently on HFNC at 50%, saturation are 95% at rest HEENT her head is shaved.  Otherwise NCAT,  HFNC, No JVD Pulmonary: Scattered rales, Bilateral chest excursion, no chest pain currently. Cardiac: S1, S2, RRR, No RMG, ST per tele  abdomen: Soft nontender, ND,  no organomegaly, BS + Extremities: Brisk capillary refill no significant edema warm, strong pulses, no obvious deformities.   Neuro: Awake  Alert and oriented x 3 no focal deficits moves all extremities, appropriate  CBC Recent Labs    04/10/18 0429 04/12/18 0500  WBC 30.0* 17.4*  HGB 9.1* 9.7*  HCT 28.9* 31.1*  PLT 262 468*    Coag's No results for input(s): APTT, INR in the last 72 hours.  BMET Recent Labs    04/10/18 0429 04/11/18 2144 04/12/18 0500  NA 140 142 142  K 3.9 3.0* 3.0*  CL 100 97* 93*  CO2 28 35* 38*  BUN 8 11 14   CREATININE 0.61 0.56 0.63  GLUCOSE 105* 203* 186*    Electrolytes Recent Labs    04/10/18 0429 04/11/18 2144 04/12/18 0500  CALCIUM 8.5* 8.2* 8.8*  MG  --  1.8 2.1    Sepsis Markers No results for input(s): PROCALCITON, O2SATVEN in the last 72 hours.  Invalid input(s): LACTICACIDVEN  ABG No results for input(s): PHART, PCO2ART, PO2ART in the last 72 hours.  Liver Enzymes Recent Labs    04/12/18 0500  AST  21  ALT 20  ALKPHOS 93  BILITOT 0.4  ALBUMIN 2.4*    Cardiac Enzymes No results for input(s): TROPONINI, PROBNP in the last 72 hours.  Glucose No results for input(s): GLUCAP in the last 72 hours.  Imaging Dg Chest Port 1 View  Result Date: 04/12/2018 CLINICAL DATA:  Acute respiratory distress syndrome EXAM: PORTABLE CHEST 1 VIEW COMPARISON:  April 11, 2018 FINDINGS: Stable right Port-A-Cath. Persistent but improving bilateral diffuse opacities. Stable cardiomediastinal silhouette. Stable  support apparatus in the spine. No pneumothorax. IMPRESSION: Improving bilateral diffuse pulmonary opacities. Electronically Signed   By: Dorise Bullion III M.D   On: 04/12/2018 08:08   Dg Chest Port 1 View  Result Date: 04/11/2018 CLINICAL DATA:  Shortness of breath.  Cough. EXAM: PORTABLE CHEST 1 VIEW COMPARISON:  Chest radiograph 04/08/2018. FINDINGS: Right anterior chest wall Port-A-Cath is present with tip projecting over the superior vena cava. Monitoring leads overlie the patient. Stable cardiac and mediastinal contours, largely obscured due to overlying opacities. Interval increase in diffuse bilateral consolidation. Probable small bilateral pleural effusions. No pneumothorax. Spinal fusion rods. IMPRESSION: Interval increase in diffuse bilateral airspace opacities which may represent pneumonia or edema. Electronically Signed   By: Lovey Newcomer M.D.   On: 04/11/2018 08:54      Impression/plan  Acute hypoxic respiratory failure-in the setting of diffuse pulmonary infiltrates.   Differential diagnosis includes community-acquired pneumonia with ARDS in immunocompromised host versus chemotherapy-induced pneumonitis versus pulmonary edema -She is nontoxic-appearing and procalcitonin is negative thus favoring drug-induced acute pneumonitis perhaps from her chemotherapy agent -Also need to consider potential viral pneumonitis versus pulmonary edema - WBC trending down but continued leukocytosis - T max last 24 hours  100.2   Plan/rec Continue current antibiotics, she is day #5 cefepime Respiratory viral panel is negative Continue  Lasix though 7/13, as is ordered Will replete potassium ( 3.0 on 7/13) Echo as above >>  Echobright density behind posterior mitral leaflet. Question if artifact  Consider  limited study with Definity to assess further, possible CT vs TEE Empiric Solu-Medrol 80 mg q. 8.  Started  on 7/12 ( ? drug-induced pneumonitis) Follow-up chest x-ray  Wean oxygen continue  BiPAP as needed, saturation goal is  Low threshold to transfer back to intensive care especially if she requires escalating oxygen Toradol as needed, pain appears to be improving with steroids Mobilize/ aggressive pulmonary toilet IS Q 1 while awake   Breast cancer with mets Plan Follow-up with oncology    Magdalen Spatz, AGACNP-BC [place] Pager # [phone]  04/12/2018  11:53 AM

## 2018-04-12 NOTE — Progress Notes (Signed)
PROGRESS NOTE    Kelly Hubbard  RXV:[id number] DOB: Oct 16, 1985 DOA: 04/08/2018 PCP: Patient, No Pcp Per  Brief Narrative: Pt. with PMH of stage IV metastatic breast cancer HER-2/NEU positive and started on chemotherapy with docetaxel, trastuzumab, Pertuzumab second cycle March 28, 2018, followed by [facility], recent ectopic pregnancy; admitted on 04/08/2018, presented with complaint of worsening shortness of breath and respiratory distress, was found to have community acquired pneumonia versus interstitial pneumonitis from chemotherapy agents. Currently further plan is to continue current management   Assessment & Plan:   Principal Problem:   Pneumonitis Active Problems:   Acute respiratory failure with hypoxemia (HCC)   Fever   Diarrhea  .  Acute respiratory distress syndrome-significant improvement with IV steroids and Lasix. Suspected community-acquired pneumonia Suspected interstitial pneumonitis. BiPAP dependent. Acute possibly diastolic CHF.  Initially presented at [facility], treated with IV antibiotics continue to deteriorate, had a CT chest abdomen and pelvis, transferred to ICU on BiPAP.  IV Bactrim, antifungal medications were also added.  Due to worsening condition patient was transferred to [facility] for further evaluation. Since arrival patient has remained on BiPAP, ID was consulted and on IV cefepime.  Blood cultures are negative.  Procalcitonin minimally elevated. BNP 300.  WBC count down to 17 from 48,000.  She is negative over 1 L the last 24 hours. Patient was admitted to the ICU service and now transferred to hospitalist service from 04/11/2018. Chest x-ray done 04/12/2018 shows improving bilateral diffuse pulmonary opacities.  Discussed with pulmonary I highly feel that the patient may have suffered interstitial pneumonitis from Taxol therapy and recommended steroids.  They agreed and patient will be started on 80 mg 3 times daily steroids  based on that recommendation.  Echocardiogram ejection fraction 55 to 60%.  Normal systolic function normal cavity size and wall thickness.??  Echo bright density behind the posterior mitral leaflet question artifact.  Recommend limited study with Definity to assess further. We will add Klonopin and low-dose morphine for symptom control.  2.  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 Per patient she is not a surgical candidate and therefore she has decided not to go for any surgery for her breast. No radiation therapy listed in the chart as well. Started on trastuzumab, Pertuzumab, docetaxel and has undergone 2 cycles so far. Received Neulasta after her recent cycle. Follows up with Dr. Bobby Rumpf for her oncology care.  3.  Recent ectopic pregnancy. Outpatient follow-up with GYN.  4.  Anxiety and depression. Secondary to acute illness. PRN Klonopin.  5.  Pain control. Currently on oxycodone. Monitor.  She has used this medication outpatient.      DVT prophylaxis: Heparin Code Status full code Family Communication: No family available Disposition Plan: TBD  Consultants: PCCM  Procedures: None   Antimicrobials: Vancomycin and cefepime Subjective: Feels better breathing better compared to yesterday.  She is on 10 L nasal cannula this morning.  Not on BiPAP able to speak in full sentences   Objective: Vitals:   04/12/18 0257 04/12/18 0600 04/12/18 0818 04/12/18 0850  BP:    125/76  Pulse:      Resp:    20  Temp:      TempSrc:      SpO2: 97%  98% 95%  Weight:  62 kg (136 lb 11 oz)    Height:        Intake/Output Summary (Last 24 hours) at 04/12/2018 1225 Last data filed at 04/12/2018 1047 Gross per 24  hour  Intake 458 ml  Output 2700 ml  Net -2242 ml   Filed Weights   04/08/18 1459 04/11/18 0401 04/12/18 0600  Weight: 67 kg (147 lb 11.3 oz) 66.5 kg (146 lb 9.7 oz) 62 kg (136 lb 11 oz)    Examination:  General exam: Appears calm and  comfortable  Respiratory system: Clear to auscultation. Respiratory effort normal. Cardiovascular system: S1 & S2 heard, RRR. No JVD, murmurs, rubs, gallops or clicks. No pedal edema. Gastrointestinal system: Abdomen is nondistended, soft and nontender. No organomegaly or masses felt. Normal bowel sounds heard. Central nervous system: Alert and oriented. No focal neurological deficits. Extremities: Symmetric 5 x 5 power. Skin: No rashes, lesions or ulcers Psychiatry: Judgement and insight appear normal. Mood & affect appropriate.     Data Reviewed: I have personally reviewed following labs and imaging studies  CBC: Recent Labs  Lab 04/08/18 1410 04/09/18 0308 04/10/18 0429 04/12/18 0500  WBC 48.0* 43.3* 30.0* 17.4*  NEUTROABS  --   --   --  15.6*  HGB 10.4* 9.3* 9.1* 9.7*  HCT 32.9* 29.5* 28.9* 31.1*  MCV 96.5 98.0 96.3 96.0  PLT 263 257 262 474*   Basic Metabolic Panel: Recent Labs  Lab 04/08/18 1410 04/09/18 0308 04/10/18 0429 04/11/18 2144 04/12/18 0500  NA 138 140 140 142 142  K 4.5 4.8 3.9 3.0* 3.0*  CL 106 107 100 97* 93*  CO2 23 26 28  35* 38*  GLUCOSE 128* 108* 105* 203* 186*  BUN <5* 6 8 11 14   CREATININE 0.58 0.67 0.61 0.56 0.63  CALCIUM 8.7* 8.5* 8.5* 8.2* 8.8*  MG 1.9 2.0  --  1.8 2.1  PHOS 1.8* 2.4*  --   --   --    GFR: Estimated Creatinine Clearance: 81.6 mL/min (by C-G formula based on SCr of 0.63 mg/dL). Liver Function Tests: Recent Labs  Lab 04/08/18 1410 04/12/18 0500  AST 37 21  ALT 20 20  ALKPHOS 119 93  BILITOT 0.2* 0.4  PROT 6.4* 6.4*  ALBUMIN 2.8* 2.4*   No results for input(s): LIPASE, AMYLASE in the last 168 hours. No results for input(s): AMMONIA in the last 168 hours. Coagulation Profile: Recent Labs  Lab 04/08/18 1410  INR 1.14   Cardiac Enzymes: No results for input(s): CKTOTAL, CKMB, CKMBINDEX, TROPONINI in the last 168 hours. BNP (last 3 results) No results for input(s): PROBNP in the last 8760 hours. HbA1C: No  results for input(s): HGBA1C in the last 72 hours. CBG: Recent Labs  Lab 04/08/18 1324  GLUCAP 131*   Lipid Profile: No results for input(s): CHOL, HDL, LDLCALC, TRIG, CHOLHDL, LDLDIRECT in the last 72 hours. Thyroid Function Tests: No results for input(s): TSH, T4TOTAL, FREET4, T3FREE, THYROIDAB in the last 72 hours. Anemia Panel: No results for input(s): VITAMINB12, FOLATE, FERRITIN, TIBC, IRON, RETICCTPCT in the last 72 hours. Sepsis Labs: Recent Labs  Lab 04/08/18 1410 04/08/18 1743  PROCALCITON 0.19  --   LATICACIDVEN 1.4 1.0    Recent Results (from the past 240 hour(s))  MRSA PCR Screening     Status: None   Collection Time: 04/08/18  1:20 PM  Result Value Ref Range Status   MRSA by PCR NEGATIVE NEGATIVE Final    Comment:        The GeneXpert MRSA Assay (FDA approved for NASAL specimens only), is one component of a comprehensive MRSA colonization surveillance program. It is not intended to diagnose MRSA infection nor to guide or monitor treatment for  MRSA infections. Performed at [name], [place] [address] [place], [place] [address]   Culture, blood (routine x 2)     Status: None (Preliminary result)   Collection Time: 04/08/18  2:00 PM  Result Value Ref Range Status   Specimen Description BLOOD RIGHT ANTECUBITAL  Final   Special Requests   Final    BOTTLES DRAWN AEROBIC ONLY Blood Culture results may not be optimal due to an inadequate volume of blood received in culture bottles   Culture   Final    NO GROWTH 3 DAYS Performed at [facility], [place] [address] [place], [place] [address]    Report Status PENDING  Incomplete  Culture, blood (routine x 2)     Status: None (Preliminary result)   Collection Time: 04/08/18  2:13 PM  Result Value Ref Range Status   Specimen Description BLOOD RIGHT ARM  Final   Special Requests   Final    BOTTLES DRAWN AEROBIC ONLY Blood Culture adequate volume   Culture   Final    NO GROWTH 3 DAYS Performed at  [facility], [address], [place], [place] [address]    Report Status PENDING  Incomplete  Urine culture     Status: None   Collection Time: 04/09/18 12:51 AM  Result Value Ref Range Status   Specimen Description URINE, RANDOM  Final   Special Requests NONE  Final   Culture   Final    NO GROWTH Performed at [facility], [address] [place], [place] [address]    Report Status 04/10/2018 FINAL  Final  Respiratory Panel by PCR     Status: None   Collection Time: 04/12/18  1:06 AM  Result Value Ref Range Status   Adenovirus NOT DETECTED NOT DETECTED Final   Coronavirus 229E NOT DETECTED NOT DETECTED Final   Coronavirus HKU1 NOT DETECTED NOT DETECTED Final   Coronavirus NL63 NOT DETECTED NOT DETECTED Final   Coronavirus OC43 NOT DETECTED NOT DETECTED Final   Metapneumovirus NOT DETECTED NOT DETECTED Final   Rhinovirus / Enterovirus NOT DETECTED NOT DETECTED Final   Influenza A NOT DETECTED NOT DETECTED Final   Influenza B NOT DETECTED NOT DETECTED Final   Parainfluenza Virus 1 NOT DETECTED NOT DETECTED Final   Parainfluenza Virus 2 NOT DETECTED NOT DETECTED Final   Parainfluenza Virus 3 NOT DETECTED NOT DETECTED Final   Parainfluenza Virus 4 NOT DETECTED NOT DETECTED Final   Respiratory Syncytial Virus NOT DETECTED NOT DETECTED Final   Bordetella pertussis NOT DETECTED NOT DETECTED Final   Chlamydophila pneumoniae NOT DETECTED NOT DETECTED Final   Mycoplasma pneumoniae NOT DETECTED NOT DETECTED Final    Comment: Performed at [facility], [place] [address] [place], [place] [address]         Radiology Studies: Dg Chest Port 1 View  Result Date: 04/12/2018 CLINICAL DATA:  Acute respiratory distress syndrome EXAM: PORTABLE CHEST 1 VIEW COMPARISON:  April 11, 2018 FINDINGS: Stable right Port-A-Cath. Persistent but improving bilateral diffuse opacities. Stable cardiomediastinal silhouette. Stable support apparatus in the spine. No pneumothorax. IMPRESSION:  Improving bilateral diffuse pulmonary opacities. Electronically Signed   By: Dorise Bullion III M.D   On: 04/12/2018 08:08   Dg Chest Port 1 View  Result Date: 04/11/2018 CLINICAL DATA:  Shortness of breath.  Cough. EXAM: PORTABLE CHEST 1 VIEW COMPARISON:  Chest radiograph 04/08/2018. FINDINGS: Right anterior chest wall Port-A-Cath is present with tip projecting over the superior  vena cava. Monitoring leads overlie the patient. Stable cardiac and mediastinal contours, largely obscured due to overlying opacities. Interval increase in diffuse bilateral consolidation. Probable small bilateral pleural effusions. No pneumothorax. Spinal fusion rods. IMPRESSION: Interval increase in diffuse bilateral airspace opacities which may represent pneumonia or edema. Electronically Signed   By: Lovey Newcomer M.D.   On: 04/11/2018 08:54        Scheduled Meds: . benzonatate  100 mg Oral TID  . budesonide (PULMICORT) nebulizer solution  0.25 mg Nebulization BID  . dextromethorphan  30 mg Oral TID  . enoxaparin (LOVENOX) injection  40 mg Subcutaneous Q24H  . feeding supplement  1 Container Oral TID BM  . guaiFENesin  600 mg Oral BID  . ipratropium-albuterol  3 mL Nebulization Q6H  . methylPREDNISolone (SOLU-MEDROL) injection  80 mg Intravenous Q8H  . saccharomyces boulardii  250 mg Oral BID   Continuous Infusions: . sodium chloride 10 mL/hr at 04/10/18 1700  . ceFEPime (MAXIPIME) IV Stopped (04/12/18 0951)  . famotidine (PEPCID) IV 20 mg (04/12/18 2130)     LOS: 4 days     Georgette Shell, MD [facility] If 7PM-7AM, please contact night-coverage [email or web address] Password [email or web address] 04/12/2018, 12:25 PM

## 2018-04-12 NOTE — Progress Notes (Signed)
  Echocardiogram 2D Echocardiogram limited with definity has been performed.  Darlina Sicilian M 04/12/2018, 2:46 PM

## 2018-04-13 ENCOUNTER — Inpatient Hospital Stay (HOSPITAL_COMMUNITY): Payer: BLUE CROSS/BLUE SHIELD

## 2018-04-13 DIAGNOSIS — J9601 Acute respiratory failure with hypoxia: Secondary | ICD-10-CM

## 2018-04-13 LAB — BASIC METABOLIC PANEL
Anion gap: 10 (ref 5–15)
BUN: 18 mg/dL (ref 6–20)
CHLORIDE: 98 mmol/L (ref 98–111)
CO2: 35 mmol/L — AB (ref 22–32)
Calcium: 8.8 mg/dL — ABNORMAL LOW (ref 8.9–10.3)
Creatinine, Ser: 0.48 mg/dL (ref 0.44–1.00)
GFR calc Af Amer: >60 mL/min (ref >60)
GFR calc non Af Amer: >60 mL/min (ref >60)
GLUCOSE: 157 mg/dL — AB (ref 70–99)
POTASSIUM: 3.6 mmol/L (ref 3.5–5.1)
Sodium: 143 mmol/L (ref 135–145)

## 2018-04-13 LAB — CBC
HEMATOCRIT: 29 % — AB (ref 36.0–46.0)
HEMOGLOBIN: 9.1 g/dL — AB (ref 12.0–15.0)
MCH: 29.8 pg (ref 26.0–34.0)
MCHC: 31.4 g/dL (ref 30.0–36.0)
MCV: 95.1 fL (ref 78.0–100.0)
Platelets: 408 10*3/uL — ABNORMAL HIGH (ref 150–400)
RBC: 3.05 MIL/uL — AB (ref 3.87–5.11)
RDW: 13.9 % (ref 11.5–15.5)
WBC: 17.3 10*3/uL — ABNORMAL HIGH (ref 4.0–10.5)

## 2018-04-13 MED ORDER — GUAIFENESIN-CODEINE 100-10 MG/5ML PO SOLN
5.0000 mL | Freq: Four times a day (QID) | ORAL | Status: DC | PRN
  Administered 2018-04-14 – 2018-04-15 (×3): 5 mL via ORAL
  Filled 2018-04-13 (×3): qty 5

## 2018-04-13 MED ORDER — BENZONATATE 100 MG PO CAPS
200.0000 mg | ORAL_CAPSULE | Freq: Two times a day (BID) | ORAL | Status: DC | PRN
  Administered 2018-04-13: 200 mg via ORAL
  Filled 2018-04-13: qty 2

## 2018-04-13 MED ORDER — FAMOTIDINE 20 MG PO TABS
20.0000 mg | ORAL_TABLET | Freq: Two times a day (BID) | ORAL | Status: DC
  Administered 2018-04-13 – 2018-04-15 (×4): 20 mg via ORAL
  Filled 2018-04-13 (×4): qty 1

## 2018-04-13 NOTE — Progress Notes (Signed)
Patient ID: Kelly Hubbard, female   DOB: 16-Feb-1986, 32 y.o.   MRN: [id number]          [facility]  Date of Admission:  04/08/2018    Total days of antibiotics 8        Day 6 cefepime         ASSESSMENT: She has shown much improvement over the last 48 hours coincident with diuresis and starting steroids.  She has now completed 8 days of empiric therapy for possible HCAP.  I will stop cefepime now.  PLAN: 1. Discontinue cefepime 2. I will sign off now  Principal Problem:   Pneumonitis Active Problems:   Acute respiratory failure with hypoxemia (HCC)   Fever   Scheduled Meds: . benzonatate  100 mg Oral TID  . budesonide (PULMICORT) nebulizer solution  0.25 mg Nebulization BID  . dextromethorphan  30 mg Oral TID  . enoxaparin (LOVENOX) injection  40 mg Subcutaneous Q24H  . famotidine  20 mg Oral BID  . feeding supplement  1 Container Oral TID BM  . guaiFENesin  600 mg Oral BID  . ipratropium-albuterol  3 mL Nebulization Q6H  . methylPREDNISolone (SOLU-MEDROL) injection  80 mg Intravenous Q8H  . saccharomyces boulardii  250 mg Oral BID   Continuous Infusions: . sodium chloride 10 mL/hr at 04/10/18 1700  . ceFEPime (MAXIPIME) IV Stopped (04/13/18 0931)   PRN Meds:.sodium chloride, acetaminophen, benzonatate, clonazePAM, guaiFENesin-codeine, levalbuterol, menthol-cetylpyridinium, morphine CONCENTRATE, ondansetron (ZOFRAN) IV, oxyCODONE, promethazine   SUBJECTIVE: She says that she is feeling much better today.  Her dry cough has improved and she is much less short of breath.  She says that her oxygen saturation does drop to 85% when she takes the supplemental nasal prong oxygen off.  She is less short of breath when up walking in her room.  She had 2 loose bowel movements last night.  Review of Systems: Review of Systems  Constitutional: Negative for chills, diaphoresis and fever.  Respiratory: Positive for cough and shortness of  breath. Negative for sputum production and wheezing.   Gastrointestinal: Negative for abdominal pain, diarrhea, nausea and vomiting.    Allergies  Allergen Reactions  . Penicillins Shortness Of Breath, Palpitations and Other (See Comments)    Husband reports pt had muscle spasms, SOB, and palpitations  . Meperidine And Related Hives    OBJECTIVE: Vitals:   04/13/18 0546 04/13/18 0749 04/13/18 0803 04/13/18 1205  BP:   (!) 143/85 126/68  Pulse:      Resp:   20 18  Temp:   98.1 F (36.7 C) 98 F (36.7 C)  TempSrc:   Axillary Oral  SpO2: 99% 100% 96% 100%  Weight:      Height:       Body mass index is 28.23 kg/m.  Physical Exam  Constitutional: She is oriented to person, place, and time.  She is looking much better over the last 48 hours.  She is sitting up in a chair watching television.  She is smiling and in good spirits.  Cardiovascular: Normal rate, regular rhythm and normal heart sounds.  No murmur heard. Pulmonary/Chest: Effort normal. She has rales.  Her lungs are clear anteriorly for the first time this admission.  Neurological: She is alert and oriented to person, place, and time.  Skin: No rash noted.  Psychiatric: She has a normal mood and affect.    Lab Results Lab Results  Component Value Date   WBC 17.3 (H)  04/13/2018   HGB 9.1 (L) 04/13/2018   HCT 29.0 (L) 04/13/2018   MCV 95.1 04/13/2018   PLT 408 (H) 04/13/2018    Lab Results  Component Value Date   CREATININE 0.48 04/13/2018   BUN 18 04/13/2018   NA 143 04/13/2018   K 3.6 04/13/2018   CL 98 04/13/2018   CO2 35 (H) 04/13/2018    Lab Results  Component Value Date   ALT 20 04/12/2018   AST 21 04/12/2018   ALKPHOS 93 04/12/2018   BILITOT 0.4 04/12/2018    Respiratory virus panel: Negative  Microbiology: Recent Results (from the past 240 hour(s))  MRSA PCR Screening     Status: None   Collection Time: 04/08/18  1:20 PM  Result Value Ref Range Status   MRSA by PCR NEGATIVE NEGATIVE  Final    Comment:        The GeneXpert MRSA Assay (FDA approved for NASAL specimens only), is one component of a comprehensive MRSA colonization surveillance program. It is not intended to diagnose MRSA infection nor to guide or monitor treatment for MRSA infections. Performed at [facility], [place] [address], [place], [place] [address]   Culture, blood (routine x 2)     Status: None (Preliminary result)   Collection Time: 04/08/18  2:00 PM  Result Value Ref Range Status   Specimen Description BLOOD RIGHT ANTECUBITAL  Final   Special Requests   Final    BOTTLES DRAWN AEROBIC ONLY Blood Culture results may not be optimal due to an inadequate volume of blood received in culture bottles   Culture   Final    NO GROWTH 4 DAYS Performed at [facility], [place] [address] [place], [place] [address]    Report Status PENDING  Incomplete  Culture, blood (routine x 2)     Status: None (Preliminary result)   Collection Time: 04/08/18  2:13 PM  Result Value Ref Range Status   Specimen Description BLOOD RIGHT ARM  Final   Special Requests   Final    BOTTLES DRAWN AEROBIC ONLY Blood Culture adequate volume   Culture   Final    NO GROWTH 4 DAYS Performed at [facility], [address], [place], [place] [address]    Report Status PENDING  Incomplete  Urine culture     Status: None   Collection Time: 04/09/18 12:51 AM  Result Value Ref Range Status   Specimen Description URINE, RANDOM  Final   Special Requests NONE  Final   Culture   Final    NO GROWTH Performed at [facility], [address] [place], [place] [address]    Report Status 04/10/2018 FINAL  Final  Respiratory Panel by PCR     Status: None   Collection Time: 04/12/18  1:06 AM  Result Value Ref Range Status   Adenovirus NOT DETECTED NOT DETECTED Final   Coronavirus 229E NOT DETECTED NOT DETECTED Final   Coronavirus HKU1 NOT DETECTED NOT DETECTED Final   Coronavirus NL63 NOT DETECTED NOT DETECTED  Final   Coronavirus OC43 NOT DETECTED NOT DETECTED Final   Metapneumovirus NOT DETECTED NOT DETECTED Final   Rhinovirus / Enterovirus NOT DETECTED NOT DETECTED Final   Influenza A NOT DETECTED NOT DETECTED Final   Influenza B NOT DETECTED NOT DETECTED Final   Parainfluenza Virus 1 NOT DETECTED NOT DETECTED Final   Parainfluenza Virus 2 NOT DETECTED NOT DETECTED Final   Parainfluenza Virus 3 NOT DETECTED NOT  DETECTED Final   Parainfluenza Virus 4 NOT DETECTED NOT DETECTED Final   Respiratory Syncytial Virus NOT DETECTED NOT DETECTED Final   Bordetella pertussis NOT DETECTED NOT DETECTED Final   Chlamydophila pneumoniae NOT DETECTED NOT DETECTED Final   Mycoplasma pneumoniae NOT DETECTED NOT DETECTED Final    Comment: Performed at [facility], [place] [address], [place], [place] [address]    Michel Bickers, [place] [phone] pager   [phone] cell 04/13/2018, 1:08 PM

## 2018-04-13 NOTE — Progress Notes (Signed)
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  Having chest pain and worsening shortness of breath placed back on noninvasive positive pressure ventilation. 7/13  Clinically improving, CXR with Improving bilateral diffuse pulmonary opacities, remains on HFNC at 12L with good saturations at rest 7/14 Cliniclly continues to improve, HFNC at 6 L, CXR with Persistent bilateral pulmonary opacities with worsening aeration to the left base.  Tests/diagnostics: Echocardiogram 7/12>>>EF 55-60% Left ventricle: The cavity size was normal. Wall thickness was   normal. Systolic function was normal. The estimated ejection   fraction was in the range of 55% to 60%. - Left atrium: Echobright density behind posterior mitral leaflet.   Question if artifact Would recomm limited study with Definity to   assess further, possible CT vs TEE  Culture data Blood cultures times two 7/9 >>> Urine culture 7/10: Negative Urine strep antigen 7/9 negative Urine Legionella antigen 7/12>>>Negative HIV antibody: 7/9: Negative Respiratory viral panel 7/12>>> Negative  Antibiotics Cefepime 7/9>> Vancomycin x1 dose 7/9  Subjective Pulmonary status continues to improve. She has been weaned to  HFNC at [place]. ( was on 12 L 7/13). She is speaking in full sentences.  She denies discomfort says she feels a  Better. She  states her sats drop to 85% when she removes her oxygen.She continues to have a non-productive cough.She has been ambulating to [facility].  Net negative last 250  cc's last  24  Objective BP (!) 143/85 (BP Location: Left Arm)   Pulse 62   Temp 98.1 F (36.7 C) (Axillary)   Resp 20   Ht _0  (1.499 m)   Wt 139 lb 12.4 oz (63.4 kg)   LMP 04/08/2018 (Exact Date)   SpO2 96%   BMI 28.23 kg/m '  Intake/Output Summary (Last 24 hours) at 04/13/2018 1019 Last data filed at 04/13/2018 0535 Gross per 24 hour  Intake 600 ml  Output 850 ml  Net -250 ml    Physical exam General: 32 year old female patient currently on HFNC at 6 L  , saturation are 96% at rest HEENT her head is shaved.  Otherwise NCAT,  HFNC, No JVD, Port a cth per right  Pulmonary: Scattered rales, Bilateral chest excursion,diminished per bases L>R, no chest pain currently. Cardiac: S1, S2, RRR, No RMG, ST per tele  abdomen: Soft nontender, ND,  no organomegaly, BS + Extremities: Brisk capillary refill no significant edema warm, strong pulses, no obvious deformities.   Neuro: Awake  Alert and oriented x 3 no focal deficits moves all extremities, appropriate  CBC Recent Labs    04/12/18 0500 04/13/18 0239  WBC 17.4* 17.3*  HGB 9.7* 9.1*  HCT 31.1* 29.0*  PLT 468* 408*    Coag's No results for input(s): APTT, INR in the last 72 hours.  BMET Recent Labs    04/11/18 2144 04/12/18 0500 04/13/18 0239  NA 142 142 143  K 3.0* 3.0* 3.6  CL 97* 93* 98  CO2 35* 38* 35*  BUN _1 CREATININE 0.56 0.63 0.48  GLUCOSE 203* 186* 157*    Electrolytes Recent Labs    04/11/18  2144 04/12/18 0500 04/13/18 0239  CALCIUM 8.2* 8.8* 8.8*  MG 1.8 2.1  --     Sepsis Markers No results for input(s): PROCALCITON, O2SATVEN in the last 72 hours.  Invalid input(s): LACTICACIDVEN  ABG No results for input(s): PHART, PCO2ART, PO2ART in the last 72 hours.  Liver Enzymes Recent Labs    04/12/18 0500  AST 21  ALT 20  ALKPHOS 93  BILITOT 0.4  ALBUMIN 2.4*    Cardiac Enzymes No results for input(s): TROPONINI, PROBNP in the last 72 hours.  Glucose No results for input(s):  GLUCAP in the last 72 hours.  Imaging Dg Chest Port 1 View  Result Date: 04/13/2018 CLINICAL DATA:  Encounter for respiratory failure EXAM: PORTABLE CHEST 1 VIEW COMPARISON:  04/12/2018 FINDINGS: There is a right chest wall port a catheter with tip projecting over the SVC. Heart size appears normal. Bilateral interstitial and airspace opacities are again noted. When compared with previous exam there has been worsening aeration to the left lower lobe. IMPRESSION: 1. Persistent bilateral pulmonary opacities with worsening aeration to the left base. Electronically Signed   By: Kerby Moors M.D.   On: 04/13/2018 08:02   Dg Chest Port 1 View  Result Date: 04/12/2018 CLINICAL DATA:  Acute respiratory distress syndrome EXAM: PORTABLE CHEST 1 VIEW COMPARISON:  April 11, 2018 FINDINGS: Stable right Port-A-Cath. Persistent but improving bilateral diffuse opacities. Stable cardiomediastinal silhouette. Stable support apparatus in the spine. No pneumothorax. IMPRESSION: Improving bilateral diffuse pulmonary opacities. Electronically Signed   By: Dorise Bullion III M.D   On: 04/12/2018 08:08      Impression/plan  Acute hypoxic respiratory failure-in the setting of diffuse pulmonary infiltrates.   Differential diagnosis includes community-acquired pneumonia with ARDS in immunocompromised host versus chemotherapy-induced pneumonitis versus pulmonary edema -She is nontoxic-appearing and procalcitonin is negative thus favoring drug-induced acute pneumonitis perhaps from her chemotherapy agent -Also need to consider potential viral pneumonitis versus pulmonary edema - WBC trending down but continued leukocytosis - T max last 24 hours  100.2  - Has not required BiPAP last 36 hours - 7/14: Persistent bilateral pulmonary opacities with worsening aeration to the left base.     Plan/rec Continue current antibiotics, she is day #6 cefepime Echo as above >>  Echobright density behind posterior mitral leaflet.  Question if artifact  Consider  limited study with Definity to assess further, possible CT vs TEE Continue Empiric Solu-Medrol 80 mg q. 8.  Started  on 7/12 ( ? drug-induced pneumonitis) Follow-up chest x-ray daily for now Wean oxygen as able   Continue BiPAP only as needed,  saturation goal is > 94% Low threshold to transfer back to intensive care especially if she requires escalating oxygen Toradol as needed, pain appears to be improving with steroids Mobilize/ aggressive pulmonary toilet IS Q 1 while awake   Breast cancer with mets Plan Follow-up with oncology    Magdalen Spatz, AGACNP-BC [place] Pager # [phone]  04/13/2018  10:19 AM

## 2018-04-13 NOTE — Progress Notes (Signed)
PROGRESS NOTE    Kelly Hubbard  VEL:[id number] DOB: September 04, 1986 DOA: 04/08/2018 PCP: Patient, No Pcp Per  Brief Narrative: Pt. with PMH ofstage IV metastatic breast cancer HER-2/NEUpositive and started on chemotherapy with docetaxel, trastuzumab, Pertuzumab second cycle March 28, 2018, followed by [facility], recent ectopic pregnancy; admitted on7/06/2018, presented with complaint ofworsening shortness of breath and respiratory distress, was found to havecommunity acquired pneumonia versus interstitial pneumonitis from chemotherapy agents. Currently further plan isto continue current management     Assessment & Plan:   Principal Problem:   Pneumonitis Active Problems:   Acute respiratory failure with hypoxemia (HCC)   Fever .Acute respiratory distress syndrome-significant improvement with IV steroids and Lasix.  Has not had BiPAP over 48 hours. Suspected community-acquired pneumonia vs Suspected interstitial pneumonitis secondary to Taxol therapy. Acute possibly diastolic CHF.  Ejection fraction 55 to 60%.REPEAT ECHO limited was ordered due to echodensity on the mitral leaflet per echo.  The limited echo which was done 04/13/2018 shows ejection fraction 55 to 60% with wall motion normal no regional wall motion abnormalities.  The echodensity seen on the atrial side of posterior mitral leaflet is known mobile and only seen in some apical views.  This is not defined any better with Definity contrast.  It could be secondary to a extrinsic compression of the 3 atrial wall or region of calcification.  If clinically indicated do a transesophageal echo.  I will hold off on doing a TEE at this time unless patient condition gets worsen or does not improve.  Initially presented at [facility], treated with IV antibiotics continue to deteriorate, had a CT chest abdomen and pelvis, transferred to ICU on BiPAP. IV Bactrim, antifungal medications were also added. Due to worsening  condition patient was transferred to [facility] for further evaluation. ID was consulted and on IV cefepime. Blood cultures are negative. Procalcitonin minimally elevated. BNP 300.  WBC count down to 17 from 48,000.  She is negative over 1 L the last 24 hours. Patient was admitted to the ICU service and now transferred to hospitalist service from 04/11/2018. Chest x-ray done 04/12/2018 shows improving bilateral diffuse pulmonary opacities.   2.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 Per patient she is not a surgical candidate and therefore she has decided not to go for any surgery for her breast. No radiation therapy listed in the chart as well. Started on trastuzumab, Pertuzumab, docetaxel and has undergone 2 cycles so far. Received Neulasta after her recent cycle. Follows up with Dr. Bobby Rumpf for her oncology care.  3.Recent ectopic pregnancy. Outpatient follow-up with GYN.  4.Anxiety and depression. Secondary to acute illness. PRN Klonopin.  5.Pain control. Currently on oxycodone. Monitor. She has used this medication outpatient.        DVT prophylaxis: Heparin Code Status: Full code Family Communication: None Disposition Plan TBD  Consultants: PCCM  Procedures: None Antimicrobials: Vanco and cefepime Subjective: Feels better continues to complain of cough which has been present since admission.   Objective: Vitals:   04/13/18 0500 04/13/18 0546 04/13/18 0749 04/13/18 0803  BP:    (!) 143/85  Pulse:      Resp:    20  Temp:    98.1 F (36.7 C)  TempSrc:    Axillary  SpO2:  99% 100% 96%  Weight: 63.4 kg (139 lb 12.4 oz)     Height:        Intake/Output Summary (Last 24 hours) at 04/13/2018 1018 Last data filed at 04/13/2018  6256 Gross per 24 hour  Intake 600 ml  Output 850 ml  Net -250 ml   Filed Weights   04/11/18 0401 04/12/18 0600 04/13/18 0500  Weight: 66.5 kg (146 lb 9.7 oz) 62 kg (136 lb 11 oz) 63.4 kg  (139 lb 12.4 oz)    Examination:  General exam: Appears calm and comfortable  Respiratory system: Few scattered rhonchi to auscultation. Respiratory effort normal. Cardiovascular system: S1 & S2 heard, RRR. No JVD, murmurs, rubs, gallops or clicks. No pedal edema. Gastrointestinal system: Abdomen is nondistended, soft and nontender. No organomegaly or masses felt. Normal bowel sounds heard. Central nervous system: Alert and oriented. No focal neurological deficits. Extremities: Symmetric 5 x 5 power. Skin: No rashes, lesions or ulcers Psychiatry: Judgement and insight appear normal. Mood & affect appropriate.     Data Reviewed: I have personally reviewed following labs and imaging studies  CBC: Recent Labs  Lab 04/08/18 1410 04/09/18 0308 04/10/18 0429 04/12/18 0500 04/13/18 0239  WBC 48.0* 43.3* 30.0* 17.4* 17.3*  NEUTROABS  --   --   --  15.6*  --   HGB 10.4* 9.3* 9.1* 9.7* 9.1*  HCT 32.9* 29.5* 28.9* 31.1* 29.0*  MCV 96.5 98.0 96.3 96.0 95.1  PLT 263 257 262 468* 389*   Basic Metabolic Panel: Recent Labs  Lab 04/08/18 1410 04/09/18 0308 04/10/18 0429 04/11/18 2144 04/12/18 0500 04/13/18 0239  NA 138 140 140 142 142 143  K 4.5 4.8 3.9 3.0* 3.0* 3.6  CL 106 107 100 97* 93* 98  CO2 23 26 28  35* 38* 35*  GLUCOSE 128* 108* 105* 203* 186* 157*  BUN <5* 6 8 11 14 18   CREATININE 0.58 0.67 0.61 0.56 0.63 0.48  CALCIUM 8.7* 8.5* 8.5* 8.2* 8.8* 8.8*  MG 1.9 2.0  --  1.8 2.1  --   PHOS 1.8* 2.4*  --   --   --   --    GFR: Estimated Creatinine Clearance: 82.5 mL/min (by C-G formula based on SCr of 0.48 mg/dL). Liver Function Tests: Recent Labs  Lab 04/08/18 1410 04/12/18 0500  AST 37 21  ALT 20 20  ALKPHOS 119 93  BILITOT 0.2* 0.4  PROT 6.4* 6.4*  ALBUMIN 2.8* 2.4*   No results for input(s): LIPASE, AMYLASE in the last 168 hours. No results for input(s): AMMONIA in the last 168 hours. Coagulation Profile: Recent Labs  Lab 04/08/18 1410  INR 1.14    Cardiac Enzymes: No results for input(s): CKTOTAL, CKMB, CKMBINDEX, TROPONINI in the last 168 hours. BNP (last 3 results) No results for input(s): PROBNP in the last 8760 hours. HbA1C: No results for input(s): HGBA1C in the last 72 hours. CBG: Recent Labs  Lab 04/08/18 1324  GLUCAP 131*   Lipid Profile: No results for input(s): CHOL, HDL, LDLCALC, TRIG, CHOLHDL, LDLDIRECT in the last 72 hours. Thyroid Function Tests: No results for input(s): TSH, T4TOTAL, FREET4, T3FREE, THYROIDAB in the last 72 hours. Anemia Panel: No results for input(s): VITAMINB12, FOLATE, FERRITIN, TIBC, IRON, RETICCTPCT in the last 72 hours. Sepsis Labs: Recent Labs  Lab 04/08/18 1410 04/08/18 1743  PROCALCITON 0.19  --   LATICACIDVEN 1.4 1.0    Recent Results (from the past 240 hour(s))  MRSA PCR Screening     Status: None   Collection Time: 04/08/18  1:20 PM  Result Value Ref Range Status   MRSA by PCR NEGATIVE NEGATIVE Final    Comment:        The GeneXpert MRSA  Assay (FDA approved for NASAL specimens only), is one component of a comprehensive MRSA colonization surveillance program. It is not intended to diagnose MRSA infection nor to guide or monitor treatment for MRSA infections. Performed at [facility], [place] [address], [place], [place] [address]   Culture, blood (routine x 2)     Status: None (Preliminary result)   Collection Time: 04/08/18  2:00 PM  Result Value Ref Range Status   Specimen Description BLOOD RIGHT ANTECUBITAL  Final   Special Requests   Final    BOTTLES DRAWN AEROBIC ONLY Blood Culture results may not be optimal due to an inadequate volume of blood received in culture bottles   Culture   Final    NO GROWTH 4 DAYS Performed at [facility], [place] [address], [place], [place] [address]    Report Status PENDING  Incomplete  Culture, blood (routine x 2)     Status: None (Preliminary result)   Collection Time: 04/08/18  2:13 PM  Result Value Ref Range  Status   Specimen Description BLOOD RIGHT ARM  Final   Special Requests   Final    BOTTLES DRAWN AEROBIC ONLY Blood Culture adequate volume   Culture   Final    NO GROWTH 4 DAYS Performed at [facility], [address], [place], [place] [address]    Report Status PENDING  Incomplete  Urine culture     Status: None   Collection Time: 04/09/18 12:51 AM  Result Value Ref Range Status   Specimen Description URINE, RANDOM  Final   Special Requests NONE  Final   Culture   Final    NO GROWTH Performed at [facility], [address], [place], [place] [address]    Report Status 04/10/2018 FINAL  Final  Respiratory Panel by PCR     Status: None   Collection Time: 04/12/18  1:06 AM  Result Value Ref Range Status   Adenovirus NOT DETECTED NOT DETECTED Final   Coronavirus 229E NOT DETECTED NOT DETECTED Final   Coronavirus HKU1 NOT DETECTED NOT DETECTED Final   Coronavirus NL63 NOT DETECTED NOT DETECTED Final   Coronavirus OC43 NOT DETECTED NOT DETECTED Final   Metapneumovirus NOT DETECTED NOT DETECTED Final   Rhinovirus / Enterovirus NOT DETECTED NOT DETECTED Final   Influenza A NOT DETECTED NOT DETECTED Final   Influenza B NOT DETECTED NOT DETECTED Final   Parainfluenza Virus 1 NOT DETECTED NOT DETECTED Final   Parainfluenza Virus 2 NOT DETECTED NOT DETECTED Final   Parainfluenza Virus 3 NOT DETECTED NOT DETECTED Final   Parainfluenza Virus 4 NOT DETECTED NOT DETECTED Final   Respiratory Syncytial Virus NOT DETECTED NOT DETECTED Final   Bordetella pertussis NOT DETECTED NOT DETECTED Final   Chlamydophila pneumoniae NOT DETECTED NOT DETECTED Final   Mycoplasma pneumoniae NOT DETECTED NOT DETECTED Final    Comment: Performed at [facility], [place] [address] [place], [place] [address]         Radiology Studies: Dg Chest Port 1 View  Result Date: 04/13/2018 CLINICAL DATA:  Encounter for respiratory failure EXAM: PORTABLE CHEST 1 VIEW COMPARISON:  04/12/2018  FINDINGS: There is a right chest wall port a catheter with tip projecting over the SVC. Heart size appears normal. Bilateral interstitial and airspace opacities are again noted. When compared with previous exam there has been worsening aeration to the left lower lobe. IMPRESSION: 1. Persistent bilateral pulmonary opacities with worsening aeration to the left  base. Electronically Signed   By: Kerby Moors M.D.   On: 04/13/2018 08:02   Dg Chest Port 1 View  Result Date: 04/12/2018 CLINICAL DATA:  Acute respiratory distress syndrome EXAM: PORTABLE CHEST 1 VIEW COMPARISON:  April 11, 2018 FINDINGS: Stable right Port-A-Cath. Persistent but improving bilateral diffuse opacities. Stable cardiomediastinal silhouette. Stable support apparatus in the spine. No pneumothorax. IMPRESSION: Improving bilateral diffuse pulmonary opacities. Electronically Signed   By: Dorise Bullion III M.D   On: 04/12/2018 08:08        Scheduled Meds: . benzonatate  100 mg Oral TID  . budesonide (PULMICORT) nebulizer solution  0.25 mg Nebulization BID  . dextromethorphan  30 mg Oral TID  . enoxaparin (LOVENOX) injection  40 mg Subcutaneous Q24H  . feeding supplement  1 Container Oral TID BM  . guaiFENesin  600 mg Oral BID  . ipratropium-albuterol  3 mL Nebulization Q6H  . methylPREDNISolone (SOLU-MEDROL) injection  80 mg Intravenous Q8H  . saccharomyces boulardii  250 mg Oral BID   Continuous Infusions: . sodium chloride 10 mL/hr at 04/10/18 1700  . ceFEPime (MAXIPIME) IV Stopped (04/13/18 0931)  . famotidine (PEPCID) IV 20 mg (04/13/18 0931)     LOS: 5 days     Georgette Shell, MD [facility]  If 7PM-7AM, please contact night-coverage [email or web address] Password [email or web address] 04/13/2018, 10:18 AM

## 2018-04-14 ENCOUNTER — Inpatient Hospital Stay (HOSPITAL_COMMUNITY): Payer: BLUE CROSS/BLUE SHIELD

## 2018-04-14 DIAGNOSIS — R0902 Hypoxemia: Secondary | ICD-10-CM

## 2018-04-14 DIAGNOSIS — R918 Other nonspecific abnormal finding of lung field: Secondary | ICD-10-CM

## 2018-04-14 LAB — BASIC METABOLIC PANEL
ANION GAP: 10 (ref 5–15)
BUN: 14 mg/dL (ref 6–20)
CHLORIDE: 100 mmol/L (ref 98–111)
CO2: 31 mmol/L (ref 22–32)
Calcium: 8.6 mg/dL — ABNORMAL LOW (ref 8.9–10.3)
Creatinine, Ser: 0.53 mg/dL (ref 0.44–1.00)
GFR calc Af Amer: >60 mL/min (ref >60)
GFR calc non Af Amer: >60 mL/min (ref >60)
GLUCOSE: 191 mg/dL — AB (ref 70–99)
Potassium: 3.6 mmol/L (ref 3.5–5.1)
SODIUM: 141 mmol/L (ref 135–145)

## 2018-04-14 LAB — CULTURE, BLOOD (ROUTINE X 2)
CULTURE: NO GROWTH
CULTURE: NO GROWTH
Special Requests: ADEQUATE

## 2018-04-14 LAB — CBC
HCT: 28.7 % — ABNORMAL LOW (ref 36.0–46.0)
HEMOGLOBIN: 8.9 g/dL — AB (ref 12.0–15.0)
MCH: 30.3 pg (ref 26.0–34.0)
MCHC: 31 g/dL (ref 30.0–36.0)
MCV: 97.6 fL (ref 78.0–100.0)
Platelets: 421 10*3/uL — ABNORMAL HIGH (ref 150–400)
RBC: 2.94 MIL/uL — AB (ref 3.87–5.11)
RDW: 14.1 % (ref 11.5–15.5)
WBC: 19.7 10*3/uL — AB (ref 4.0–10.5)

## 2018-04-14 MED ORDER — PREDNISONE 20 MG PO TABS: 40.0000 mg | ORAL_TABLET | Freq: Every day | ORAL | Status: DC

## 2018-04-14 MED ORDER — METHYLPREDNISOLONE SODIUM SUCC 125 MG IJ SOLR
80.0000 mg | Freq: Three times a day (TID) | INTRAMUSCULAR | Status: AC
  Administered 2018-04-14: 80 mg via INTRAVENOUS
  Filled 2018-04-14: qty 2

## 2018-04-14 MED ORDER — SODIUM CHLORIDE 0.9% FLUSH
10.0000 mL | Freq: Two times a day (BID) | INTRAVENOUS | Status: DC
  Administered 2018-04-14: 20 mL
  Administered 2018-04-14: 10 mL

## 2018-04-14 MED ORDER — SODIUM CHLORIDE 0.9% FLUSH
10.0000 mL | INTRAVENOUS | Status: DC | PRN
  Administered 2018-04-15: 10 mL
  Filled 2018-04-14: qty 40

## 2018-04-14 MED ORDER — PREDNISONE 20 MG PO TABS
40.0000 mg | ORAL_TABLET | Freq: Every day | ORAL | Status: DC
  Administered 2018-04-15: 40 mg via ORAL
  Filled 2018-04-14: qty 2

## 2018-04-14 NOTE — Plan of Care (Signed)
Discussed with patient plan of care for the evening, pain management and bedtime medications with some teach back displayed 

## 2018-04-14 NOTE — Progress Notes (Signed)
PROGRESS NOTE    Kelly Hubbard  WJX:[id number] DOB: 1986-07-04 DOA: 04/08/2018 PCP: Patient, No Pcp Per  Brief Narrative:Pt. 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 Currently further plan isto continue current management     Assessment & Plan:   Principal Problem:   Pneumonitis Active Problems:   Acute respiratory failure with hypoxemia (HCC)   Fever   Acute respiratory distress syndrome-significant improvement with IV steroids and Lasix.  Has not had BiPAP over 48 hours. Suspected community-acquired pneumonia vs Suspected interstitial pneumonitis secondary to Taxol therapy. Acute possibly diastolic CHF.  Ejection fraction 55 to 60%.REPEAT ECHO limited was ordered due to echodensity on the mitral leaflet per echo.  The limited echo which was done 04/13/2018 shows ejection fraction 55 to 60% with wall motion normal no regional wall motion abnormalities.  The echodensity seen on the atrial side of posterior mitral leaflet is known mobile and only seen in some apical views.  This is not defined any better with Definity contrast.  It could be secondary to a extrinsic compression of the 3 atrial wall or region of calcification.  If clinically indicated do a transesophageal echo.  I will hold off on doing a TEE at this time unless patient condition gets worsen or does not improve.  Initially presented at [facility], treated with IV antibiotics continue to deteriorate, had a CT chest abdomen and pelvis, transferred to ICU on BiPAP. IV Bactrim, antifungal medications were also added. Due to worsening  condition patient was transferred to [facility] for further evaluation. ID was consulted and on IV cefepime. Blood cultures are negative. Procalcitonin minimally elevated. BNP 300Patient was admitted to the ICU service and now transferred to hospitalist service from 04/11/2018. Chest x-raydone 04/14/2018 shows improving bilateral diffuse pulmonary opacities.  Pulmonary following taper steroids per pulmonary.   2.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 Per patient she is not a surgical candidate and therefore she has decided not to go for any surgery for her breast. No radiation therapy listed in the chart as well. Started on trastuzumab, Pertuzumab, docetaxel and has undergone 2 cycles so far. Received Neulasta after her recent cycle. Follows up with Dr. Bobby Rumpf for her oncology care.   3.Recent ectopic pregnancy. Outpatient follow-up with GYN.  4.Anxiety and depression. Secondary to acute illness. PRN Klonopin.  5.Pain control. Currently on oxycodone. Monitor. She has used this medication outpatient.     DVT prophylaxis:heparin Code Status:full Family Communication none Disposition Plan: TBD  Consultants:pccm  Procedures: none Antimicrobials: none Subjective: Awake alert sitting up in the chair in no acute distress feels better.  Cough is better.  Breathing is better.  Objective: Vitals:   04/14/18 0600 04/14/18 0610 04/14/18 0805 04/14/18 0828  BP:  124/81 112/68 112/68  Pulse:   (!) 114 96  Resp:    15  Temp:  98.2 F (36.8 C)    TempSrc:  Oral    SpO2: (!) 87% 99% 99% 99%  Weight:      Height:        Intake/Output Summary (Last 24 hours) at 04/14/2018 1031 Last data filed at 04/14/2018 0800 Gross per 24 hour  Intake 1694.41 ml  Output 401 ml  Net 1293.41 ml  Filed Weights   04/12/18 0600 04/13/18 0500 04/14/18 0401  Weight: 62 kg (136 lb 11 oz) 63.4 kg (139 lb 12.4 oz) 63.4 kg (139 lb 12.4 oz)     Examination:  General exam: Appears calm and comfortable  Respiratory system: few scattered rhonchi  auscultation. Respiratory effort normal. Cardiovascular system: S1 & S2 heard, RRR. No JVD, murmurs, rubs, gallops or clicks. No pedal edema. Gastrointestinal system: Abdomen is nondistended, soft and nontender. No organomegaly or masses felt. Normal bowel sounds heard. Central nervous system: Alert and oriented. No focal neurological deficits. Extremities: Symmetric 5 x 5 power. Skin: No rashes, lesions or ulcers Psychiatry: Judgement and insight appear normal. Mood & affect appropriate.     Data Reviewed: I have personally reviewed following labs and imaging studies  CBC: Recent Labs  Lab 04/09/18 0308 04/10/18 0429 04/12/18 0500 04/13/18 0239 04/14/18 0300  WBC 43.3* 30.0* 17.4* 17.3* 19.7*  NEUTROABS  --   --  15.6*  --   --   HGB 9.3* 9.1* 9.7* 9.1* 8.9*  HCT 29.5* 28.9* 31.1* 29.0* 28.7*  MCV 98.0 96.3 96.0 95.1 97.6  PLT 257 262 468* 408* 141*   Basic Metabolic Panel: Recent Labs  Lab 04/08/18 1410 04/09/18 0308 04/10/18 0429 04/11/18 2144 04/12/18 0500 04/13/18 0239 04/14/18 0300  NA 138 140 140 142 142 143 141  K 4.5 4.8 3.9 3.0* 3.0* 3.6 3.6  CL 106 107 100 97* 93* 98 100  CO2 23 26 28  35* 38* 35* 31  GLUCOSE 128* 108* 105* 203* 186* 157* 191*  BUN <5* 6 8 11 14 18 14   CREATININE 0.58 0.67 0.61 0.56 0.63 0.48 0.53  CALCIUM 8.7* 8.5* 8.5* 8.2* 8.8* 8.8* 8.6*  MG 1.9 2.0  --  1.8 2.1  --   --   PHOS 1.8* 2.4*  --   --   --   --   --    GFR: Estimated Creatinine Clearance: 82.5 mL/min (by C-G formula based on SCr of 0.53 mg/dL). Liver Function Tests: Recent Labs  Lab 04/08/18 1410 04/12/18 0500  AST 37 21  ALT 20 20  ALKPHOS 119 93  BILITOT 0.2* 0.4  PROT 6.4* 6.4*  ALBUMIN 2.8* 2.4*   No results for input(s): LIPASE, AMYLASE in the last 168 hours. No results for input(s): AMMONIA in the last 168 hours. Coagulation Profile: Recent Labs   Lab 04/08/18 1410  INR 1.14   Cardiac Enzymes: No results for input(s): CKTOTAL, CKMB, CKMBINDEX, TROPONINI in the last 168 hours. BNP (last 3 results) No results for input(s): PROBNP in the last 8760 hours. HbA1C: No results for input(s): HGBA1C in the last 72 hours. CBG: Recent Labs  Lab 04/08/18 1324  GLUCAP 131*   Lipid Profile: No results for input(s): CHOL, HDL, LDLCALC, TRIG, CHOLHDL, LDLDIRECT in the last 72 hours. Thyroid Function Tests: No results for input(s): TSH, T4TOTAL, FREET4, T3FREE, THYROIDAB in the last 72 hours. Anemia Panel: No results for input(s): VITAMINB12, FOLATE, FERRITIN, TIBC, IRON, RETICCTPCT in the last 72 hours. Sepsis Labs: Recent Labs  Lab 04/08/18 1410 04/08/18 1743  PROCALCITON 0.19  --   LATICACIDVEN 1.4 1.0    Recent Results (from the past 240 hour(s))  MRSA PCR Screening     Status: None   Collection Time: 04/08/18  1:20 PM  Result Value Ref Range Status   MRSA by PCR NEGATIVE NEGATIVE Final    Comment:        The GeneXpert MRSA Assay (FDA approved  for NASAL specimens only), is one component of a comprehensive MRSA colonization surveillance program. It is not intended to diagnose MRSA infection nor to guide or monitor treatment for MRSA infections. Performed at [facility], [place] [address], [place], [place] [address]   Culture, blood (routine x 2)     Status: None (Preliminary result)   Collection Time: 04/08/18  2:00 PM  Result Value Ref Range Status   Specimen Description BLOOD RIGHT ANTECUBITAL  Final   Special Requests   Final    BOTTLES DRAWN AEROBIC ONLY Blood Culture results may not be optimal due to an inadequate volume of blood received in culture bottles   Culture   Final    NO GROWTH 4 DAYS Performed at [facility], [place] [address], [place], [place] [address]    Report Status PENDING  Incomplete  Culture, blood (routine x 2)     Status: None (Preliminary result)   Collection Time: 04/08/18   2:13 PM  Result Value Ref Range Status   Specimen Description BLOOD RIGHT ARM  Final   Special Requests   Final    BOTTLES DRAWN AEROBIC ONLY Blood Culture adequate volume   Culture   Final    NO GROWTH 4 DAYS Performed at [facility], [address] [place], [place] [address]    Report Status PENDING  Incomplete  Urine culture     Status: None   Collection Time: 04/09/18 12:51 AM  Result Value Ref Range Status   Specimen Description URINE, RANDOM  Final   Special Requests NONE  Final   Culture   Final    NO GROWTH Performed at [facility], [address], [place], [place] [address]    Report Status 04/10/2018 FINAL  Final  Respiratory Panel by PCR     Status: None   Collection Time: 04/12/18  1:06 AM  Result Value Ref Range Status   Adenovirus NOT DETECTED NOT DETECTED Final   Coronavirus 229E NOT DETECTED NOT DETECTED Final   Coronavirus HKU1 NOT DETECTED NOT DETECTED Final   Coronavirus NL63 NOT DETECTED NOT DETECTED Final   Coronavirus OC43 NOT DETECTED NOT DETECTED Final   Metapneumovirus NOT DETECTED NOT DETECTED Final   Rhinovirus / Enterovirus NOT DETECTED NOT DETECTED Final   Influenza A NOT DETECTED NOT DETECTED Final   Influenza B NOT DETECTED NOT DETECTED Final   Parainfluenza Virus 1 NOT DETECTED NOT DETECTED Final   Parainfluenza Virus 2 NOT DETECTED NOT DETECTED Final   Parainfluenza Virus 3 NOT DETECTED NOT DETECTED Final   Parainfluenza Virus 4 NOT DETECTED NOT DETECTED Final   Respiratory Syncytial Virus NOT DETECTED NOT DETECTED Final   Bordetella pertussis NOT DETECTED NOT DETECTED Final   Chlamydophila pneumoniae NOT DETECTED NOT DETECTED Final   Mycoplasma pneumoniae NOT DETECTED NOT DETECTED Final    Comment: Performed at [facility], [place] [address] [place], [place] [address]         Radiology Studies: Dg Chest Port 1 View  Result Date: 04/14/2018 CLINICAL DATA:  Shortness of Breath EXAM: PORTABLE CHEST 1 VIEW COMPARISON:   April 13, 2018 FINDINGS: Port-A-Cath tip is in the superior vena cava. No pneumothorax. There are small pleural effusions bilaterally with mild bibasilar atelectasis. No consolidation. Heart is upper normal in size with pulmonary vascularity normal. No adenopathy. There is rod fixation in the thoracic and upper lumbar spine regions, stable. IMPRESSION: No pneumothorax. Small layering effusions bilaterally with mild  bibasilar atelectasis. No consolidation. Stable cardiac silhouette. Electronically Signed   By: Lowella Grip III M.D.   On: 04/14/2018 07:01   Dg Chest Port 1 View  Result Date: 04/13/2018 CLINICAL DATA:  Encounter for respiratory failure EXAM: PORTABLE CHEST 1 VIEW COMPARISON:  04/12/2018 FINDINGS: There is a right chest wall port a catheter with tip projecting over the SVC. Heart size appears normal. Bilateral interstitial and airspace opacities are again noted. When compared with previous exam there has been worsening aeration to the left lower lobe. IMPRESSION: 1. Persistent bilateral pulmonary opacities with worsening aeration to the left base. Electronically Signed   By: Kerby Moors M.D.   On: 04/13/2018 08:02        Scheduled Meds: . benzonatate  100 mg Oral TID  . budesonide (PULMICORT) nebulizer solution  0.25 mg Nebulization BID  . dextromethorphan  30 mg Oral TID  . enoxaparin (LOVENOX) injection  40 mg Subcutaneous Q24H  . famotidine  20 mg Oral BID  . feeding supplement  1 Container Oral TID BM  . guaiFENesin  600 mg Oral BID  . ipratropium-albuterol  3 mL Nebulization Q6H  . methylPREDNISolone (SOLU-MEDROL) injection  80 mg Intravenous Q8H  . saccharomyces boulardii  250 mg Oral BID   Continuous Infusions: . sodium chloride 10 mL/hr at 04/14/18 0526     LOS: 6 days     Georgette Shell, MD [facility]  If 7PM-7AM, please contact night-coverage [email or web address] Password [facility] 04/14/2018, 10:31 AM

## 2018-04-14 NOTE — Progress Notes (Addendum)
Brief 32 year old recently diagnosed 01/2018 with stage IV breast cancer metastatic to lung, received chemotherapy with Taxotere and anti-HER-2 drugs cancer center in [place] and admitted 7/9 with fevers and bilateral predominantly lower lobe consolidation noted on CT chest and abdomen She required BiPAP transiently and has transitioned to nonrebreather   Interval  7/11 moved to stepdown unit, on nonrebreather seem to improve with antibiotics 7/12 clinically worsened over the night.  Having chest pain and worsening shortness of breath placed back on noninvasive positive pressure ventilation. 7/13  Clinically improving, CXR with Improving bilateral diffuse pulmonary opacities, remains on HFNC at 12L with good saturations at rest  Tests/diagnostics: Echocardiogram 7/12>>>EF 55-60% Left ventricle: The cavity size was normal. Wall thickness was   normal. Systolic function was normal. The estimated ejection   fraction was in the range of 55% to 60%. - Left atrium: Echobright density behind posterior mitral leaflet.   Question if artifact Would recomm limited study with Definity to   assess further, possible CT vs TEE  Culture data Blood cultures times two 7/9 >>> Urine culture 7/10: Negative Urine strep antigen 7/9 negative Urine Legionella antigen 7/12>>> HIV antibody: 7/9: Negative Respiratory viral panel 7/12>>> Negative  Antibiotics Cefepime 7/9>> Vancomycin x1 dose 7/9  Subjective No distress feels much better  Objective Blood Pressure 112/65 (BP Location: Left Arm)   Pulse 82   Temperature 98.4 F (36.9 C) (Oral)   Respiration 17   Height 4' 11"  (1.499 m)   Weight 139 lb 12.4 oz (63.4 kg)   Last Menstrual Period 04/08/2018 (Exact Date)   Oxygen Saturation 98%   Body Mass Index 28.23 kg/m '  Intake/Output Summary (Last 24 hours) at 04/14/2018 1418 Last data filed at 04/14/2018 0800 Gross per 24 hour  Intake 1694.41 ml  Output 401 ml  Net 1293.41 ml   2 liters    Physical exam General 32 year old white female.  HENT NCAT. MMM.  Pulm decreased in bases no accessory use  Card RRR  Ext no edema  abd NT + bowel sounds  Neuro awake and alert   CBC Recent Labs    04/12/18 0500 04/13/18 0239 04/14/18 0300  WBC 17.4* 17.3* 19.7*  HGB 9.7* 9.1* 8.9*  HCT 31.1* 29.0* 28.7*  PLT 468* 408* 421*    Coag's No results for input(s): APTT, INR in the last 72 hours.  BMET Recent Labs    04/12/18 0500 04/13/18 0239 04/14/18 0300  NA 142 143 141  K 3.0* 3.6 3.6  CL 93* 98 100  CO2 38* 35* 31  BUN 14 18 14   CREATININE 0.63 0.48 0.53  GLUCOSE 186* 157* 191*    Electrolytes Recent Labs    04/11/18 2144 04/12/18 0500 04/13/18 0239 04/14/18 0300  CALCIUM 8.2* 8.8* 8.8* 8.6*  MG 1.8 2.1  --   --     Sepsis Markers No results for input(s): PROCALCITON, O2SATVEN in the last 72 hours.  Invalid input(s): LACTICACIDVEN  ABG No results for input(s): PHART, PCO2ART, PO2ART in the last 72 hours.  Liver Enzymes Recent Labs    04/12/18 0500  AST 21  ALT 20  ALKPHOS 93  BILITOT 0.4  ALBUMIN 2.4*    Cardiac Enzymes No results for input(s): TROPONINI, PROBNP in the last 72 hours.  Glucose No results for input(s): GLUCAP in the last 72 hours.  Imaging Dg Chest Port 1 View  Result Date: 04/14/2018 CLINICAL DATA:  Shortness of Breath EXAM: PORTABLE CHEST 1 VIEW COMPARISON:  April 13, 2018 FINDINGS: Port-A-Cath tip is in the superior vena cava. No pneumothorax. There are small pleural effusions bilaterally with mild bibasilar atelectasis. No consolidation. Heart is upper normal in size with pulmonary vascularity normal. No adenopathy. There is rod fixation in the thoracic and upper lumbar spine regions, stable. IMPRESSION: No pneumothorax. Small layering effusions bilaterally with mild bibasilar atelectasis. No consolidation. Stable cardiac silhouette. Electronically Signed   By: Lowella Grip III M.D.   On: 04/14/2018 07:01   Dg  Chest Port 1 View  Result Date: 04/13/2018 CLINICAL DATA:  Encounter for respiratory failure EXAM: PORTABLE CHEST 1 VIEW COMPARISON:  04/12/2018 FINDINGS: There is a right chest wall port a catheter with tip projecting over the SVC. Heart size appears normal. Bilateral interstitial and airspace opacities are again noted. When compared with previous exam there has been worsening aeration to the left lower lobe. IMPRESSION: 1. Persistent bilateral pulmonary opacities with worsening aeration to the left base. Electronically Signed   By: Kerby Moors M.D.   On: 04/13/2018 08:02      Impression/plan  Acute hypoxic respiratory failure-in the setting of diffuse pulmonary infiltrates.   Differential diagnosis includes community-acquired pneumonia with ARDS in immunocompromised host versus chemotherapy-induced pneumonitis versus pulmonary edema -She is nontoxic-appearing and procalcitonin is negative thus favoring drug-induced acute pneumonitis perhaps from her chemotherapy agent -RVP negative  -Portable chest x-ray personally reviewed: Remarkable improvement in diffuse pulmonary infiltrates compared with prior film on the 14th and 13th -Antibiotics have been discontinued after completing 8 days of therapy, infectious disease has signed off -Solu-Medrol started 8/12  Plan/rec Change Solu-Medrol to prednisone 40 mg daily with slow taper (decrease by 41m every 7d) Wean oxygen  Will need to re-eval chemotherapy agents F/u w/ oncology    Breast cancer with mets Plan Follow-up with oncology   We will s/o  PErick ColaceACNP-BC LNew GalileePager # [phone] # [phone] no answer  04/14/2018  2:18 PM  Attending Note:  32year old female with breast cancer, s/p chemo induced pulmonary toxicity.  Patient responded well to solumedrol and diureses.  On exam, lungs with bibasilar crackles.  I reviewed CXR myself, significant improvement in aeration.  Discussed with  PCCM-NP.  Hypoxemia:  - Titrate O2 for sat of 90-92%  - Will need an ambulatory desat study for home O2 at least short term  Respiratory failure:  - ?evaluation of chemotherapeutic agents  - Change solumedrol to 40 mg of PO prednisone daily and decrease by 10 mg every 7 days as a slow taper  Pulmonary infiltrate   - F/u radiographically if changes clinically   PCCM will sign off, please call back if needed.  Patient seen and examined, agree with above note.  I dictated the care and orders written for this patient under my direction.  YRush Farmer MBad Axe

## 2018-04-14 NOTE — Progress Notes (Signed)
Inpatient Diabetes Program Recommendations  AACE/ADA: New Consensus Statement on Inpatient Glycemic Control (2015)  Target Ranges:  Prepandial:   less than 140 mg/dL      Peak postprandial:   less than 180 mg/dL (1-2 hours)      Critically ill patients:  140 - 180 mg/dL   Results for Kelly Hubbard, Kelly Hubbard (MRN [id number]) as of 04/14/2018 10:33  Ref. Range 04/11/2018 21:44 04/12/2018 05:00 04/13/2018 02:39 04/14/2018 03:00  Glucose Latest Ref Range: 70 - 99 mg/dL 203 (H) 186 (H) 157 (H) 191 (H)    Admit with: Acute Resp Failure/ Pneumonia  History: Stage IV Breast Cancer with Mets to Lungs  NO History of Diabetes noted.    MD- Note patient getting Solumedrol 80 mg Q8 hours.  Lab glucose levels elevated.  Please consider placing orders for CBG checks TID AC + HS and  Novolog Sensitive Correction Scale/ SSI (0-9 units) TID AC + HS     --Will follow patient during hospitalization--  Wyn Quaker RN, MSN, CDE Diabetes Coordinator Inpatient Glycemic Control Team Team Pager: [phone] (8a-5p)

## 2018-04-14 NOTE — Plan of Care (Signed)
Discussed with patient plan of care for the evening, pain management and taking slow when the patient goes home with some teach back displayed.

## 2018-04-14 NOTE — Progress Notes (Signed)
Patient refuses CPAP at this time, no distress noted, patient understands to contact respiratory if she were to change her mind. RCP will continue to follow.

## 2018-04-15 LAB — BASIC METABOLIC PANEL
Anion gap: 9 (ref 5–15)
BUN: 17 mg/dL (ref 6–20)
CO2: 29 mmol/L (ref 22–32)
CREATININE: 0.58 mg/dL (ref 0.44–1.00)
Calcium: 8.4 mg/dL — ABNORMAL LOW (ref 8.9–10.3)
Chloride: 101 mmol/L (ref 98–111)
GFR calc Af Amer: >60 mL/min (ref >60)
GLUCOSE: 190 mg/dL — AB (ref 70–99)
Potassium: 4 mmol/L (ref 3.5–5.1)
SODIUM: 139 mmol/L (ref 135–145)

## 2018-04-15 LAB — CBC
HCT: 28.6 % — ABNORMAL LOW (ref 36.0–46.0)
Hemoglobin: 9 g/dL — ABNORMAL LOW (ref 12.0–15.0)
MCH: 30 pg (ref 26.0–34.0)
MCHC: 31.5 g/dL (ref 30.0–36.0)
MCV: 95.3 fL (ref 78.0–100.0)
PLATELETS: 461 10*3/uL — AB (ref 150–400)
RBC: 3 MIL/uL — ABNORMAL LOW (ref 3.87–5.11)
RDW: 14.4 % (ref 11.5–15.5)
WBC: 22.6 10*3/uL — ABNORMAL HIGH (ref 4.0–10.5)

## 2018-04-15 MED ORDER — GUAIFENESIN-CODEINE 100-10 MG/5ML PO SOLN: 5.0000 mL | Freq: Four times a day (QID) | ORAL | 0 refills | Status: AC | PRN

## 2018-04-15 MED ORDER — PREDNISONE 20 MG PO TABS: 40.0000 mg | ORAL_TABLET | Freq: Every day | ORAL | 0 refills | Status: AC

## 2018-04-15 MED ORDER — IPRATROPIUM-ALBUTEROL 0.5-2.5 (3) MG/3ML IN SOLN: 3.0000 mL | Freq: Two times a day (BID) | RESPIRATORY_TRACT | Status: DC

## 2018-04-15 MED ORDER — MENTHOL 3 MG MT LOZG: 1.0000 | LOZENGE | OROMUCOSAL | 12 refills | Status: AC | PRN

## 2018-04-15 MED ORDER — FAMOTIDINE 20 MG PO TABS: 20.0000 mg | ORAL_TABLET | Freq: Two times a day (BID) | ORAL | Status: AC

## 2018-04-15 MED ORDER — HEPARIN SOD (PORK) LOCK FLUSH 100 UNIT/ML IV SOLN
500.0000 [IU] | INTRAVENOUS | Status: AC | PRN
  Administered 2018-04-15: 500 [IU]

## 2018-04-15 MED ORDER — ACETAMINOPHEN 325 MG PO TABS: 650.0000 mg | ORAL_TABLET | Freq: Four times a day (QID) | ORAL | Status: AC | PRN

## 2018-04-15 MED ORDER — BENZONATATE 200 MG PO CAPS: 200.0000 mg | ORAL_CAPSULE | Freq: Two times a day (BID) | ORAL | 0 refills | Status: AC | PRN

## 2018-04-15 NOTE — Progress Notes (Signed)
Spoke with patient during first treatment and she stated she wanted to do her 2am nebulizer, upon walking in the room the patient had a change of heart and no longer wanted her breathing treatment. No distress noted RCP will continue to follow.

## 2018-04-15 NOTE — Progress Notes (Signed)
Reviewed d/c instructions with patient.  IV team came to unit and de-accessed pt's port-a-cath.  Pt was discharged home with husband and family.  VSS upon d/c.

## 2018-04-15 NOTE — Progress Notes (Signed)
Patient had a change of mind, now wants her breathing treatment.

## 2018-04-15 NOTE — Discharge Summary (Addendum)
Physician Discharge Summary  [place] MBW:[id number] DOB: Feb 19, 1986 DOA: 04/08/2018  PCP: Patient, No Pcp Per  Admit date: 04/08/2018 Discharge date: 04/15/2018  Admitted From: Home Disposition: Home  Recommendations for Outpatient Follow-up:  1. Follow up with PCP in 1-2 weeks 2. Please obtain BMP/CBC in one week 3. Follow-up with oncologist 4. Follow Up with pulmonologist 5. Up with cardiologist Dr. Einar Gip for outpatient TEE.  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 Currently further plan isto continue current management     Discharge Diagnoses:  Principal Problem:   Pneumonitis Active Problems:   Acute respiratory failure with hypoxemia (HCC)   Fever   Hypoxemia   Pulmonary infiltrate  1]Acute respiratory distress syndrome-patient was admitted with a suspected diagnosis of community-acquired pneumonia versus interstitial pneumonitis secondary to Taxol versus diastolic CHF.  She was treated with IV steroids IV Lasix with and IV antibiotics.  Patient's symptoms improved with the above treatment.  Initially she was dependent on BiPAP but after these treatments were started we were able to wean her BiPAP down to nasal cannula prior to discharge.  Patient had a echocardiogram which showed ejection fraction 55 to 60%.  A repeat limited echo was done because of echodensity in the mitral leaflet.  The echodensity was described in the limited  echo as as mobile and only seen in some apical views.  A transesophageal echo was not done since patient remained stable and afebrile and improved.  She will follow-up with the cardiologist to get a follow-up echo. Patient will be discharged home on a slow taper of prednisone over 1 month.  She will also  follow-up with pulmonary. Initially presented at [facility], treated with IV antibiotics continue to deteriorate, had a CT chest abdomen and pelvis, transferred to ICU on BiPAP. IV Bactrim, antifungal medications were also added. Due to worsening condition patient was transferred to [facility] for further evaluation. ID was consulted and on IV cefepime. Blood cultures are negative. Procalcitonin minimally elevated. BNP 300Patient was admitted to the ICU service and now transferred to hospitalist service from 04/11/2018. Chest x-raydone 04/14/2018 shows improving bilateral diffuse pulmonary opacities.  Patient will be discharged home on home oxygen at 2 L at least for short period of time since her oxygen saturations dropped while ambulating on room air to 87% with a cough.  Patient has ongoing persistent cough. 2.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 Per patient she is not a surgical candidate and therefore she has decided not to go for any surgery for her breast. No radiation therapy listed in the chart as well. Started on trastuzumab, Pertuzumab, docetaxel and has undergone 2 cycles so far. Received Neulasta after her recent cycle. Follows up with Dr. Bobby Rumpf for her oncology care.   3.Recent ectopic pregnancy. Outpatient follow-up with GYN.  4.Anxiety and depression. Secondary to acute illness. PRN Klonopin.  5.Pain control. Continue  on oxycodone        Discharge Instructions  Discharge Instructions    Call MD for:  difficulty breathing, headache or visual disturbances   Complete by:  As directed  Call MD for:   persistant dizziness or light-headedness   Complete by:  As directed    Call MD for:  persistant nausea and vomiting   Complete by:  As directed    Call MD for:  severe uncontrolled pain   Complete by:  As directed    Call MD for:  temperature >100.4   Complete by:  As directed    Diet - low sodium heart healthy   Complete by:  As directed    Increase activity slowly   Complete by:  As directed      Allergies as of 04/15/2018      Reactions   Penicillins Shortness Of Breath, Palpitations, Other (See Comments)   Husband reports pt had muscle spasms, SOB, and palpitations   Meperidine And Related Hives      Medication List    STOP taking these medications   dexamethasone 4 MG tablet Commonly known as:  DECADRON     TAKE these medications   acetaminophen 325 MG tablet Commonly known as:  TYLENOL Take 2 tablets (650 mg total) by mouth every 6 (six) hours as needed for mild pain, fever or headache.   benzonatate 200 MG capsule Commonly known as:  TESSALON Take 1 capsule (200 mg total) by mouth 2 (two) times daily as needed for cough.   famotidine 20 MG tablet Commonly known as:  PEPCID Take 1 tablet (20 mg total) by mouth 2 (two) times daily.   guaiFENesin-codeine 100-10 MG/5ML syrup Take 5 mLs by mouth every 6 (six) hours as needed for up to 14 days for cough.   menthol-cetylpyridinium 3 MG lozenge Commonly known as:  CEPACOL Take 1 lozenge (3 mg total) by mouth as needed for sore throat.   ondansetron 4 MG tablet Commonly known as:  ZOFRAN Take 4 mg by mouth every 4 (four) hours as needed for nausea.   oxyCODONE 5 MG immediate release tablet Commonly known as:  Oxy IR/ROXICODONE Take 5 mg by mouth See admin instructions. Every 4-6 hours.   predniSONE 20 MG tablet Commonly known as:  DELTASONE Take 2 tablets (40 mg total) by mouth daily with breakfast. Take 40 mg daily for the first 7 days then 30 mg daily for the next 7 days then 20 mg daily for the following 7  days and then take 10 mg daily.   PRESCRIPTION MEDICATION Apply 1-2 mLs topically 4 (four) times daily as needed for pain. MORPHINE GEL 5MG/ML   prochlorperazine 10 MG tablet Commonly known as:  COMPAZINE Take 10 mg by mouth every 4 (four) hours as needed for nausea.      Follow-up Information    [facility] Follow up.   Specialty:  Pulmonology Contact information: [place] [phone]         Allergies  Allergen Reactions  . Penicillins Shortness Of Breath, Palpitations and Other (See Comments)    Husband reports pt had muscle spasms, SOB, and palpitations  . Meperidine And Related Hives    Consultations: pccm Procedures/Studies: Dg Chest Port 1 View  Result Date: 04/14/2018 CLINICAL DATA:  Shortness of Breath EXAM: PORTABLE CHEST 1 VIEW COMPARISON:  April 13, 2018 FINDINGS: Port-A-Cath tip is in the superior vena cava. No pneumothorax. There are small pleural effusions bilaterally with mild bibasilar atelectasis. No consolidation. Heart is upper normal in size with pulmonary vascularity normal. No adenopathy. There is rod fixation in the thoracic and upper lumbar spine regions, stable. IMPRESSION: No  pneumothorax. Small layering effusions bilaterally with mild bibasilar atelectasis. No consolidation. Stable cardiac silhouette. Electronically Signed   By: Lowella Grip III M.D.   On: 04/14/2018 07:01   Dg Chest Port 1 View  Result Date: 04/13/2018 CLINICAL DATA:  Encounter for respiratory failure EXAM: PORTABLE CHEST 1 VIEW COMPARISON:  04/12/2018 FINDINGS: There is a right chest wall port a catheter with tip projecting over the SVC. Heart size appears normal. Bilateral interstitial and airspace opacities are again noted. When compared with previous exam there has been worsening aeration to the left lower lobe. IMPRESSION: 1. Persistent bilateral pulmonary opacities with worsening aeration to the left base.  Electronically Signed   By: Kerby Moors M.D.   On: 04/13/2018 08:02   Dg Chest Port 1 View  Result Date: 04/12/2018 CLINICAL DATA:  Acute respiratory distress syndrome EXAM: PORTABLE CHEST 1 VIEW COMPARISON:  April 11, 2018 FINDINGS: Stable right Port-A-Cath. Persistent but improving bilateral diffuse opacities. Stable cardiomediastinal silhouette. Stable support apparatus in the spine. No pneumothorax. IMPRESSION: Improving bilateral diffuse pulmonary opacities. Electronically Signed   By: Dorise Bullion III M.D   On: 04/12/2018 08:08   Dg Chest Port 1 View  Result Date: 04/11/2018 CLINICAL DATA:  Shortness of breath.  Cough. EXAM: PORTABLE CHEST 1 VIEW COMPARISON:  Chest radiograph 04/08/2018. FINDINGS: Right anterior chest wall Port-A-Cath is present with tip projecting over the superior vena cava. Monitoring leads overlie the patient. Stable cardiac and mediastinal contours, largely obscured due to overlying opacities. Interval increase in diffuse bilateral consolidation. Probable small bilateral pleural effusions. No pneumothorax. Spinal fusion rods. IMPRESSION: Interval increase in diffuse bilateral airspace opacities which may represent pneumonia or edema. Electronically Signed   By: Lovey Newcomer M.D.   On: 04/11/2018 08:54   Dg Chest Port 1 View  Result Date: 04/08/2018 CLINICAL DATA:  Acute respiratory failure with hypoxia EXAM: PORTABLE CHEST 1 VIEW COMPARISON:  Portable chest x-ray of earlier today. FINDINGS: The lungs are borderline hypoinflated. There confluent interstitial and alveolar opacities bilaterally greatest on the left. The hemidiaphragms are partially obscured greatest on the left. The heart is not enlarged. The pulmonary vascularity is not clearly engorged. The power port catheter tip projects over the midportion of the SVC. Harrington rods are present and stable. IMPRESSION: Fairly stable appearance of the chest. Bilateral interstitial and alveolar opacities compatible with  pneumonia or less likely interstitial edema. Electronically Signed   By: David  Martinique M.D.   On: 04/08/2018 13:50   (Echo, Carotid, EGD, Colonoscopy, ERCP)    Subjective:   Discharge Exam: Vitals:   04/15/18 0809 04/15/18 0841  BP: 127/83   Pulse: 67   Resp: 18   Temp: 97.9 F (36.6 C)   SpO2: 100% 100%   Vitals:   04/15/18 0500 04/15/18 0600 04/15/18 0809 04/15/18 0841  BP:   127/83   Pulse:   67   Resp:   18   Temp:   97.9 F (36.6 C)   TempSrc:   Oral   SpO2: 99% 97% 100% 100%  Weight:      Height:        General: Pt is alert, awake, not in acute distress Cardiovascular: RRR, S1/S2 +, no rubs, no gallops Respiratory: CTA bilaterally, no wheezing, no rhonchi Abdominal: Soft, NT, ND, bowel sounds + Extremities: no edema, no cyanosis    The results of significant diagnostics from this hospitalization (including imaging, microbiology, ancillary and laboratory) are listed below for reference.     Microbiology: Recent  Results (from the past 240 hour(s))  MRSA PCR Screening     Status: None   Collection Time: 04/08/18  1:20 PM  Result Value Ref Range Status   MRSA by PCR NEGATIVE NEGATIVE Final    Comment:        The GeneXpert MRSA Assay (FDA approved for NASAL specimens only), is one component of a comprehensive MRSA colonization surveillance program. It is not intended to diagnose MRSA infection nor to guide or monitor treatment for MRSA infections. Performed at [facility], [place] [address] [place], [place] [address]   Culture, blood (routine x 2)     Status: None   Collection Time: 04/08/18  2:00 PM  Result Value Ref Range Status   Specimen Description BLOOD RIGHT ANTECUBITAL  Final   Special Requests   Final    BOTTLES DRAWN AEROBIC ONLY Blood Culture results may not be optimal due to an inadequate volume of blood received in culture bottles   Culture   Final    NO GROWTH 6 DAYS Performed at [facility], [place] [address]  [place], [place] [address]    Report Status 04/14/2018 FINAL  Final  Culture, blood (routine x 2)     Status: None   Collection Time: 04/08/18  2:13 PM  Result Value Ref Range Status   Specimen Description BLOOD RIGHT ARM  Final   Special Requests   Final    BOTTLES DRAWN AEROBIC ONLY Blood Culture adequate volume   Culture   Final    NO GROWTH 6 DAYS Performed at [facility], [place] [address], [place], [place] [address]    Report Status 04/14/2018 FINAL  Final  Urine culture     Status: None   Collection Time: 04/09/18 12:51 AM  Result Value Ref Range Status   Specimen Description URINE, RANDOM  Final   Special Requests NONE  Final   Culture   Final    NO GROWTH Performed at [facility], [place] [address] [place], [place] [address]    Report Status 04/10/2018 FINAL  Final  Respiratory Panel by PCR     Status: None   Collection Time: 04/12/18  1:06 AM  Result Value Ref Range Status   Adenovirus NOT DETECTED NOT DETECTED Final   Coronavirus 229E NOT DETECTED NOT DETECTED Final   Coronavirus HKU1 NOT DETECTED NOT DETECTED Final   Coronavirus NL63 NOT DETECTED NOT DETECTED Final   Coronavirus OC43 NOT DETECTED NOT DETECTED Final   Metapneumovirus NOT DETECTED NOT DETECTED Final   Rhinovirus / Enterovirus NOT DETECTED NOT DETECTED Final   Influenza A NOT DETECTED NOT DETECTED Final   Influenza B NOT DETECTED NOT DETECTED Final   Parainfluenza Virus 1 NOT DETECTED NOT DETECTED Final   Parainfluenza Virus 2 NOT DETECTED NOT DETECTED Final   Parainfluenza Virus 3 NOT DETECTED NOT DETECTED Final   Parainfluenza Virus 4 NOT DETECTED NOT DETECTED Final   Respiratory Syncytial Virus NOT DETECTED NOT DETECTED Final   Bordetella pertussis NOT DETECTED NOT DETECTED Final   Chlamydophila pneumoniae NOT DETECTED NOT DETECTED Final   Mycoplasma pneumoniae NOT DETECTED NOT DETECTED Final    Comment: Performed at [facility], [place] [address] [place], [place] [address]      Labs: BNP (last 3 results) Recent Labs    04/08/18 1410  BNP 563.1*   Basic Metabolic Panel: Recent Labs  Lab 04/08/18 1410 04/09/18 0308  04/11/18 2144 04/12/18 0500 04/13/18 0239  04/14/18 0300 04/15/18 0213  NA 138 140   < > 142 142 143 141 139  K 4.5 4.8   < > 3.0* 3.0* 3.6 3.6 4.0  CL 106 107   < > 97* 93* 98 100 101  CO2 23 26   < > 35* 38* 35* 31 29  GLUCOSE 128* 108*   < > 203* 186* 157* 191* 190*  BUN <5* 6   < > 11 14 18 14 17   CREATININE 0.58 0.67   < > 0.56 0.63 0.48 0.53 0.58  CALCIUM 8.7* 8.5*   < > 8.2* 8.8* 8.8* 8.6* 8.4*  MG 1.9 2.0  --  1.8 2.1  --   --   --   PHOS 1.8* 2.4*  --   --   --   --   --   --    < > = values in this interval not displayed.   Liver Function Tests: Recent Labs  Lab 04/08/18 1410 04/12/18 0500  AST 37 21  ALT 20 20  ALKPHOS 119 93  BILITOT 0.2* 0.4  PROT 6.4* 6.4*  ALBUMIN 2.8* 2.4*   No results for input(s): LIPASE, AMYLASE in the last 168 hours. No results for input(s): AMMONIA in the last 168 hours. CBC: Recent Labs  Lab 04/10/18 0429 04/12/18 0500 04/13/18 0239 04/14/18 0300 04/15/18 0213  WBC 30.0* 17.4* 17.3* 19.7* 22.6*  NEUTROABS  --  15.6*  --   --   --   HGB 9.1* 9.7* 9.1* 8.9* 9.0*  HCT 28.9* 31.1* 29.0* 28.7* 28.6*  MCV 96.3 96.0 95.1 97.6 95.3  PLT 262 468* 408* 421* 461*   Cardiac Enzymes: No results for input(s): CKTOTAL, CKMB, CKMBINDEX, TROPONINI in the last 168 hours. BNP: Invalid input(s): POCBNP CBG: Recent Labs  Lab 04/08/18 1324  GLUCAP 131*   D-Dimer No results for input(s): DDIMER in the last 72 hours. Hgb A1c No results for input(s): HGBA1C in the last 72 hours. Lipid Profile No results for input(s): CHOL, HDL, LDLCALC, TRIG, CHOLHDL, LDLDIRECT in the last 72 hours. Thyroid function studies No results for input(s): TSH, T4TOTAL, T3FREE, THYROIDAB in the last 72 hours.  Invalid input(s): FREET3 Anemia work up No results for input(s): VITAMINB12, FOLATE, FERRITIN, TIBC,  IRON, RETICCTPCT in the last 72 hours. Urinalysis    Component Value Date/Time   COLORURINE STRAW (A) 04/08/2018 2125   APPEARANCEUR CLEAR 04/08/2018 2125   LABSPEC 1.009 04/08/2018 2125   PHURINE 7.0 04/08/2018 2125   GLUCOSEU NEGATIVE 04/08/2018 2125   HGBUR SMALL (A) 04/08/2018 2125   BILIRUBINUR NEGATIVE 04/08/2018 2125   [place] NEGATIVE 04/08/2018 2125   PROTEINUR NEGATIVE 04/08/2018 2125   NITRITE NEGATIVE 04/08/2018 2125   LEUKOCYTESUR NEGATIVE 04/08/2018 2125   Sepsis Labs Invalid input(s): PROCALCITONIN,  WBC,  LACTICIDVEN Microbiology Recent Results (from the past 240 hour(s))  MRSA PCR Screening     Status: None   Collection Time: 04/08/18  1:20 PM  Result Value Ref Range Status   MRSA by PCR NEGATIVE NEGATIVE Final    Comment:        The GeneXpert MRSA Assay (FDA approved for NASAL specimens only), is one component of a comprehensive MRSA colonization surveillance program. It is not intended to diagnose MRSA infection nor to guide or monitor treatment for MRSA infections. Performed at [facility], [place] [address] [place], [place] [address]   Culture, blood (routine x 2)     Status: None   Collection Time:  04/08/18  2:00 PM  Result Value Ref Range Status   Specimen Description BLOOD RIGHT ANTECUBITAL  Final   Special Requests   Final    BOTTLES DRAWN AEROBIC ONLY Blood Culture results may not be optimal due to an inadequate volume of blood received in culture bottles   Culture   Final    NO GROWTH 6 DAYS Performed at [place] [address], [place], [place] [address]    Report Status 04/14/2018 FINAL  Final  Culture, blood (routine x 2)     Status: None   Collection Time: 04/08/18  2:13 PM  Result Value Ref Range Status   Specimen Description BLOOD RIGHT ARM  Final   Special Requests   Final    BOTTLES DRAWN AEROBIC ONLY Blood Culture adequate volume   Culture   Final    NO GROWTH 6 DAYS Performed at [facility], [place] [address] [place], [place] [address]    Report Status 04/14/2018 FINAL  Final  Urine culture     Status: None   Collection Time: 04/09/18 12:51 AM  Result Value Ref Range Status   Specimen Description URINE, RANDOM  Final   Special Requests NONE  Final   Culture   Final    NO GROWTH Performed at [facility], [place] [address], [place], [place] [address]    Report Status 04/10/2018 FINAL  Final  Respiratory Panel by PCR     Status: None   Collection Time: 04/12/18  1:06 AM  Result Value Ref Range Status   Adenovirus NOT DETECTED NOT DETECTED Final   Coronavirus 229E NOT DETECTED NOT DETECTED Final   Coronavirus HKU1 NOT DETECTED NOT DETECTED Final   Coronavirus NL63 NOT DETECTED NOT DETECTED Final   Coronavirus OC43 NOT DETECTED NOT DETECTED Final   Metapneumovirus NOT DETECTED NOT DETECTED Final   Rhinovirus / Enterovirus NOT DETECTED NOT DETECTED Final   Influenza A NOT DETECTED NOT DETECTED Final   Influenza B NOT DETECTED NOT DETECTED Final   Parainfluenza Virus 1 NOT DETECTED NOT DETECTED Final   Parainfluenza Virus 2 NOT DETECTED NOT DETECTED Final   Parainfluenza Virus 3 NOT DETECTED NOT DETECTED Final   Parainfluenza Virus 4 NOT DETECTED NOT DETECTED Final   Respiratory Syncytial Virus NOT DETECTED NOT DETECTED Final   Bordetella pertussis NOT DETECTED NOT DETECTED Final   Chlamydophila pneumoniae NOT DETECTED NOT DETECTED Final   Mycoplasma pneumoniae NOT DETECTED NOT DETECTED Final    Comment: Performed at [facility], [place] [address] [place], [place] [address]     Time coordinating discharge: 34 minutes  SIGNED:   Georgette Shell, MD  [facility] 04/15/2018, 10:03 AM Pager   If 7PM-7AM, please contact night-coverage [email or web address] Password [email or web address]

## 2018-04-15 NOTE — Care Management Note (Signed)
Case Management Note  Patient Details  Name: Kelly Hubbard MRN: [id number] Date of Birth: 02-17-1986  Subjective/Objective:    From home with spouse, presents with stage 4 breast ca with mets to lungs, acute resp failure, she is refusing surgery, on iv abx.  7/15 Tomi Bamberger RN, BSN - conts on iv steroids will taper and change to po, pt to see patient to get up oob,     7/16 Tomi Bamberger RN, BSN-   patient for dc today, she chose [facility] for HHPT, and [facility] for rolling walker and home oxygen. Referral made to [facility] with Alvis Lemmings and Butch Penny for DME.                               Action/Plan: DC home with home oxygen , rolling walker and HH services.   Expected Discharge Date:  04/15/18               Expected Discharge Plan:  [place]  In-House Referral:     Discharge planning Services  CM Consult  Post Acute Care Choice:  Durable Medical Equipment, Home Health Choice offered to:  Patient  DME Arranged:  Walker rolling, Oxygen DME Agency:  [place] Arranged:  PT [place]:  [place]  Status of Service:  Completed, signed off  If discussed at [place] of Stay Meetings, dates discussed:    Additional Comments:  Zenon Mayo, RN 04/15/2018, 11:49 AM

## 2018-04-15 NOTE — Progress Notes (Signed)
PCP is Dr. Haydee Salter at [facility] in [facility].

## 2018-04-15 NOTE — Progress Notes (Signed)
SATURATION QUALIFICATIONS: (This note is used to comply with regulatory documentation for home oxygen)  Patient Saturations on Room Air at Rest = 98%  Patient Saturations on Room Air while Ambulating = 87% with coughing  Patient Saturations on 3 Liters of oxygen while Ambulating =95%  Please briefly explain why patient needs home oxygen: Pt with increased coughing with ambulation causing drop it O2 saturation. Pt would benefit from supplemental O2 until able to ambulate with decreased coughing.  Olegario Emberson B. Migdalia Dk PT, DPT Acute Rehabilitation  [phone] Pager [phone]

## 2018-04-15 NOTE — Evaluation (Signed)
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  Patient is on a BiPAP and history is very hard to obtain.  From records, presents to [place] with SOB and was noted to have multiple opacities on chest CT with worsening infiltrate. Transferred to [facility] 7/9. CXR with Improving bilateral diffuse pulmonary opacities.  Clinical Impression  PTA pt independent in community ambulation, driving and caring for 87 and 33 year old children. Pt currently limited in safe mobilization by oxygen desaturation (see General Comments), and increased HR with activity as well as generalized weakness and decreased balance and endurance. Pt currently mod I for transfers and supervision for ambulation of 40 feet with RW with 3/4 DoE. Pt required multiple breaks with ambulation for coughing and breath recovery. PT recommending HHPT at d/c to improve strength and endurance to safely navigate in her home environment. PT will continue to follow acutely.     Follow Up Recommendations Home health PT    Equipment Recommendations  Rolling walker with 5" wheels    Recommendations for Other Services       Precautions / Restrictions Precautions Precautions: None Restrictions Weight Bearing Restrictions: No      Mobility  Bed Mobility               General bed mobility comments: OOB in recliner   Transfers Overall transfer level: Modified independent Equipment used: None             General transfer comment: strong power up and steadying, c/o of minor dizziness, HR increased to 152 bpm decreased back to 138 bpm within 2 minutes   Ambulation/Gait Ambulation/Gait assistance: Supervision Gait Distance (Feet): 40  Feet Assistive device: Rolling walker (2 wheeled) Gait Pattern/deviations: Decreased stride length;Shuffle;Step-through pattern Gait velocity: slowed Gait velocity interpretation: 1.31 - 2.62 ft/sec, indicative of limited community ambulator General Gait Details: supervision for safety, slow, steady gait with 3/4 DoE and multiple rest breaks for coughing and to recover breath       Balance Overall balance assessment: Mild deficits observed, not formally tested                                           Pertinent Vitals/Pain Pain Assessment: 0-10 Pain Score: 7  Pain Location: L breast  Pain Descriptors / Indicators: Sore;Aching Pain Intervention(s): Limited activity within patient's tolerance;Monitored during session;Repositioned    Home Living Family/patient expects to be discharged to:: Private residence Living Arrangements: Spouse/significant other;Children Available Help at Discharge: Family;Available 24 hours/day Type of Home: House Home Access: Ramped entrance     Home Layout: Two level;Able to live on main level with bedroom/bathroom Home Equipment: Grab bars - tub/shower;Hand held shower head      Prior Function Level of Independence: Independent         Comments: community level ambulation, driving, mother of 87 and 53 year olds     Hand Dominance        Extremity/Trunk Assessment   Upper Extremity Assessment Upper Extremity Assessment: Generalized weakness    Lower Extremity Assessment Lower Extremity Assessment: Generalized weakness       Communication  Communication: No difficulties  Cognition Arousal/Alertness: Awake/alert Behavior During Therapy: WFL for tasks assessed/performed Overall Cognitive Status: Within Functional Limits for tasks assessed                                        General Comments General comments (skin integrity, edema, etc.): Pt on 3 L O2 via [place] upon entry, SaO2 100%O2, supplemental  O2 removed and SaO2 dropped to 91% O2, with ambulation pt with increased coughing and SaO2 on RA dropped to 87%O2, pt placed back on 3L O2 via nasal cannula and SaO2 revbounded to 95%O2, HR variable throughout session HR in sitting 138 bpm , with standing increased to 152 bpm, with standing 2 min dropped to 138 bpm, with ambulation increased to 148 bpm, after ambulation sitting in recliner HR dropped to 120 bpm        Assessment/Plan    PT Assessment Patient needs continued PT services  PT Problem List Decreased strength;Decreased activity tolerance;Decreased balance;Decreased mobility;Cardiopulmonary status limiting activity       PT Treatment Interventions DME instruction;Gait training;Functional mobility training;Therapeutic activities;Therapeutic exercise;Balance training;Patient/family education    PT Goals (Current goals can be found in the Care Plan section)  Acute Rehab PT Goals Patient Stated Goal: go home to be with kids PT Goal Formulation: With patient Time For Goal Achievement: 04/29/18 Potential to Achieve Goals: Good    Frequency Min 3X/week    AM-PAC PT "6 Clicks" Daily Activity  Outcome Measure Difficulty turning over in bed (including adjusting bedclothes, sheets and blankets)?: A Little Difficulty moving from lying on back to sitting on the side of the bed? : A Little Difficulty sitting down on and standing up from a chair with arms (e.g., wheelchair, bedside commode, etc,.)?: A Little Help needed moving to and from a bed to chair (including a wheelchair)?: A Little Help needed walking in hospital room?: A Little Help needed climbing 3-5 steps with a railing? : A Lot 6 Click Score: 17    End of Session Equipment Utilized During Treatment: Gait belt;Oxygen Activity Tolerance: Patient limited by fatigue Patient left: in chair;with call bell/phone within reach;with family/visitor present Nurse Communication: Mobility status;Other (comment)(oxygen  desaturation) PT Visit Diagnosis: Other abnormalities of gait and mobility (R26.89);Muscle weakness (generalized) (M62.81);Difficulty in walking, not elsewhere classified (R26.2);Dizziness and giddiness (R42)    Time: 9390-3009 PT Time Calculation (min) (ACUTE ONLY): 34 min   Charges:   PT Evaluation $PT Eval Moderate Complexity: 1 Mod PT Treatments $Gait Training: 8-22 mins   PT G Codes:        Charlett Merkle B. Migdalia Dk PT, DPT Acute Rehabilitation  [phone] Pager [phone]    [place] 04/15/2018, 10:17 AM

## 2018-04-18 DIAGNOSIS — R12 Heartburn: Secondary | ICD-10-CM

## 2018-04-18 DIAGNOSIS — R05 Cough: Secondary | ICD-10-CM | POA: Diagnosis not present

## 2018-04-18 DIAGNOSIS — C50112 Malignant neoplasm of central portion of left female breast: Secondary | ICD-10-CM

## 2018-04-18 DIAGNOSIS — Z7952 Long term (current) use of systemic steroids: Secondary | ICD-10-CM | POA: Diagnosis not present

## 2018-04-25 DIAGNOSIS — C50112 Malignant neoplasm of central portion of left female breast: Secondary | ICD-10-CM | POA: Diagnosis not present

## 2018-04-29 ENCOUNTER — Encounter (HOSPITAL_COMMUNITY): Payer: Self-pay

## 2018-04-29 ENCOUNTER — Ambulatory Visit (HOSPITAL_COMMUNITY)
Admission: RE | Admit: 2018-04-29 | Discharge: 2018-04-29 | Disposition: A | Discharge status: Home / Self Care | Payer: BLUE CROSS/BLUE SHIELD | Source: Ambulatory Visit | Attending: Cardiology | Admitting: Cardiology

## 2018-04-29 ENCOUNTER — Ambulatory Visit (HOSPITAL_COMMUNITY)
Admission: RE | Admit: 2018-04-29 | Discharge: 2018-04-29 | Disposition: A | Discharge status: Home / Self Care | Payer: BLUE CROSS/BLUE SHIELD | Source: Ambulatory Visit | Admitting: Cardiology

## 2018-04-29 ENCOUNTER — Encounter (HOSPITAL_COMMUNITY)
Admission: RE | Disposition: A | Discharge status: Home / Self Care | Payer: Self-pay | Source: Ambulatory Visit | Attending: Cardiology

## 2018-04-29 ENCOUNTER — Other Ambulatory Visit: Payer: Self-pay

## 2018-04-29 DIAGNOSIS — C7800 Secondary malignant neoplasm of unspecified lung: Secondary | ICD-10-CM | POA: Insufficient documentation

## 2018-04-29 DIAGNOSIS — Z885 Allergy status to narcotic agent status: Secondary | ICD-10-CM | POA: Insufficient documentation

## 2018-04-29 DIAGNOSIS — Z87891 Personal history of nicotine dependence: Secondary | ICD-10-CM | POA: Diagnosis not present

## 2018-04-29 DIAGNOSIS — R931 Abnormal findings on diagnostic imaging of heart and coronary circulation: Secondary | ICD-10-CM | POA: Insufficient documentation

## 2018-04-29 DIAGNOSIS — C50919 Malignant neoplasm of unspecified site of unspecified female breast: Secondary | ICD-10-CM | POA: Diagnosis not present

## 2018-04-29 DIAGNOSIS — Z880 Allergy status to penicillin: Secondary | ICD-10-CM | POA: Diagnosis not present

## 2018-04-29 HISTORY — PX: TEE WITHOUT CARDIOVERSION: SHX5443

## 2018-04-29 SURGERY — ECHOCARDIOGRAM, TRANSESOPHAGEAL
Anesthesia: Moderate Sedation

## 2018-04-29 MED ORDER — MIDAZOLAM HCL 5 MG/5ML IJ SOLN
INTRAMUSCULAR | Status: DC | PRN
  Administered 2018-04-29: 2 mg via INTRAVENOUS

## 2018-04-29 MED ORDER — MIDAZOLAM HCL 5 MG/ML IJ SOLN
INTRAMUSCULAR | Status: AC
  Filled 2018-04-29: qty 3

## 2018-04-29 MED ORDER — FENTANYL CITRATE (PF) 100 MCG/2ML IJ SOLN
INTRAMUSCULAR | Status: AC
  Filled 2018-04-29: qty 2

## 2018-04-29 MED ORDER — HEPARIN SOD (PORK) LOCK FLUSH 100 UNIT/ML IV SOLN
500.0000 [IU] | INTRAVENOUS | Status: AC | PRN
  Administered 2018-04-29: 500 [IU]

## 2018-04-29 MED ORDER — FENTANYL CITRATE (PF) 100 MCG/2ML IJ SOLN
INTRAMUSCULAR | Status: DC | PRN
  Administered 2018-04-29: 50 ug via INTRAVENOUS

## 2018-04-29 MED ORDER — SODIUM CHLORIDE 0.9 % IV SOLN: INTRAVENOUS | Status: DC

## 2018-04-29 MED ORDER — BUTAMBEN-TETRACAINE-BENZOCAINE 2-2-14 % EX AERO
INHALATION_SPRAY | CUTANEOUS | Status: DC | PRN
  Administered 2018-04-29: 2 via TOPICAL

## 2018-04-29 NOTE — Interval H&P Note (Signed)
History and Physical Interval Note:  04/29/2018 10:49 AM  Kelly Hubbard  has presented today for surgery, with the diagnosis of ABNORMAL ECHOCARDIOGRAM  The various methods of treatment have been discussed with the patient and family. After consideration of risks, benefits and other options for treatment, the patient has consented to  Procedure(s): TRANSESOPHAGEAL ECHOCARDIOGRAM (TEE) (N/A) as a surgical intervention .  The patient's history has been reviewed, patient examined, no change in status, stable for surgery.  I have reviewed the patient's chart and labs.  Questions were answered to the patient's satisfaction.     [place]

## 2018-04-29 NOTE — H&P (Signed)
Kelly Hubbard is an 32 y.o. female.   Chief Complaint: Abnormal echocardiogram HPI:   32 yo female with PMH ofstage IV metastatic breast cancer HER-2/NEUpositive and started on chemotherapy with docetaxel, trastuzumab, Pertuzumab second cycle March 28, 2018, followed by [facility], recent ectopic pregnancy; admitted on7/06/2018, presented with complaint ofworsening shortness of breath and respiratory distress, was found to havecommunity acquired pneumonia versus interstitial pneumonitis from chemotherapy agents. Currently further plan isto continue current management  There was question raised about ?echodensitiy on atrial side of posterior mitral valve leaflet. She was thus referred for TEE to undergo further evaluation. Patient denies any infectious symptoms at this time.   Past Medical History:  Diagnosis Date  . Breast cancer metastasized to lung [facility]) 01/2018    Past Surgical History:  Procedure Laterality Date  . BACK SURGERY  06/1999  . CESAREAN SECTION    . CESAREAN SECTION WITH BILATERAL TUBAL LIGATION      Family History  Problem Relation Age of Onset  . Heart Problems Mother   . Hypertension Mother   . Diabetes Father   . Cancer Maternal Grandmother   . Stroke Maternal Grandmother    Social History:  reports that she has quit smoking. Her smoking use included cigarettes. She has never used smokeless tobacco. She reports that she drank alcohol. She reports that she does not use drugs.  Allergies:  Allergies  Allergen Reactions  . Penicillins Shortness Of Breath, Palpitations and Other (See Comments)    Husband reported muscle spasms,  Has patient had a PCN reaction causing immediate rash, facial/tongue/throat swelling, SOB or lightheadedness with hypotension: Yes Has patient had a PCN reaction causing severe rash involving mucus membranes or skin necrosis: No Has patient had a PCN reaction that required hospitalization: Unknown Has patient had a PCN  reaction occurring within the last 10 years: Yes If all of the above answers are "NO", then may proceed with Cephalosporin use.   . Meperidine And Related Hives    Medications Prior to Admission  Medication Sig Dispense Refill  . benzonatate (TESSALON) 200 MG capsule Take 1 capsule (200 mg total) by mouth 2 (two) times daily as needed for cough. 20 capsule 0  . famotidine (PEPCID) 20 MG tablet Take 1 tablet (20 mg total) by mouth 2 (two) times daily.    Marland Kitchen guaiFENesin-codeine 100-10 MG/5ML syrup Take 5 mLs by mouth every 6 (six) hours as needed for up to 14 days for cough. 120 mL 0  . menthol-cetylpyridinium (CEPACOL) 3 MG lozenge Take 1 lozenge (3 mg total) by mouth as needed for sore throat. 100 tablet 12  . ondansetron (ZOFRAN) 4 MG tablet Take 4 mg by mouth every 4 (four) hours as needed for nausea.  2  . Oxycodone HCl 10 MG TABS Take 10 mg by mouth every 4 (four) hours as needed. for pain  0  . predniSONE (DELTASONE) 20 MG tablet Take 2 tablets (40 mg total) by mouth daily with breakfast. Take 40 mg daily for the first 7 days then 30 mg daily for the next 7 days then 20 mg daily for the following 7 days and then take 10 mg daily. 40 tablet 0  . PRESCRIPTION MEDICATION Apply 1-2 mLs topically 4 (four) times daily as needed for pain. MORPHINE GEL 5MG/ML  0  . prochlorperazine (COMPAZINE) 10 MG tablet Take 10 mg by mouth every 4 (four) hours as needed for nausea.  5  . acetaminophen (TYLENOL) 325 MG tablet Take 2 tablets (  650 mg total) by mouth every 6 (six) hours as needed for mild pain, fever or headache.      No results found for this or any previous visit (from the past 48 hour(s)). No results found.  Review of Systems  Constitutional: Negative for chills and fever.  HENT: Negative.   Eyes: Negative.   Respiratory: Negative.   Cardiovascular: Negative for chest pain and palpitations.  Gastrointestinal: Negative.   Genitourinary: Negative.   Musculoskeletal: Negative.    Neurological: Negative for dizziness and loss of consciousness.  Endo/Heme/Allergies: Negative.   Psychiatric/Behavioral: Negative.     Blood pressure 102/64, pulse (!) 103, temperature 98.4 F (36.9 C), temperature source Oral, resp. rate 14, last menstrual period 04/29/2018, SpO2 96 %. Physical Exam  Nursing note and vitals reviewed. Constitutional: She is oriented to person, place, and time. She appears well-developed and well-nourished.  HENT:  Head: Normocephalic and atraumatic.  Eyes: Pupils are equal, round, and reactive to light. Conjunctivae are normal.  Neck: No JVD present.  Cardiovascular: Normal rate, regular rhythm and normal heart sounds.  No murmur heard. Respiratory: Effort normal and breath sounds normal. She has no wheezes. She has no rales.  Port catheter  GI: Soft. Bowel sounds are normal. There is no tenderness.  Musculoskeletal: She exhibits no edema.  Neurological: She is alert and oriented to person, place, and time. She has normal reflexes.  Skin: Skin is warm and dry.  Psychiatric: She has a normal mood and affect.     Assessment/Plan TEE for evaluation of mitral valve echodensity  Nigel Mormon, MD 04/29/2018, 10:37 AM  Nigel Mormon, MD [facility] [place] Pager: [phone] Office: [phone] If no answer Cell [phone]

## 2018-04-29 NOTE — CV Procedure (Signed)
TEE: Under moderate sedation, TEE was performed without complications: LV: Normal. Normal EF. RV: Normal LA: Normal. Left atrial appendage: Normal without thrombus. Normal function. Inter atrial septum is intact without defect. Double contrast study negative for atrial level shunting. Late appearance of bubbles suggests possible noncardiac shunt. RA: Normal  MV: Normal Trace MR. TV: Normal Trace TR AV: Normal. No AI or AS. PV: Normal. Trace PI.  Thoracic and ascending aorta: Normal without significant plaque or atheromatous changes.  Conscious sedation protocol was followed, I personally administered conscious sedation and monitored the patient. Patient received 2 milligrams of Versed and 50 . Patient tolerated the procedure well and there was no complication from conscious sedation. Time administered was 15 min and procedure ended at 11:17 AM.  Nigel Mormon, MD [facility] [place] Pager: [phone] Office: [phone] If no answer Cell [phone]

## 2018-04-29 NOTE — Progress Notes (Signed)
  Echocardiogram Echocardiogram Transesophageal has been performed.  Johny Chess 04/29/2018, 11:35 AM

## 2018-04-29 NOTE — Discharge Instructions (Signed)

## 2018-05-26 DIAGNOSIS — C50112 Malignant neoplasm of central portion of left female breast: Secondary | ICD-10-CM | POA: Diagnosis not present

## 2018-05-26 DIAGNOSIS — Z9012 Acquired absence of left breast and nipple: Secondary | ICD-10-CM

## 2018-05-26 DIAGNOSIS — Z171 Estrogen receptor negative status [ER-]: Secondary | ICD-10-CM | POA: Diagnosis not present

## 2018-05-29 DIAGNOSIS — Z171 Estrogen receptor negative status [ER-]: Secondary | ICD-10-CM

## 2018-05-29 DIAGNOSIS — Z9012 Acquired absence of left breast and nipple: Secondary | ICD-10-CM

## 2018-05-29 DIAGNOSIS — C7801 Secondary malignant neoplasm of right lung: Secondary | ICD-10-CM

## 2018-05-29 DIAGNOSIS — C50112 Malignant neoplasm of central portion of left female breast: Secondary | ICD-10-CM | POA: Diagnosis not present

## 2018-05-29 DIAGNOSIS — C7802 Secondary malignant neoplasm of left lung: Secondary | ICD-10-CM | POA: Diagnosis not present

## 2018-07-14 DIAGNOSIS — C50112 Malignant neoplasm of central portion of left female breast: Secondary | ICD-10-CM

## 2018-07-14 DIAGNOSIS — C7801 Secondary malignant neoplasm of right lung: Secondary | ICD-10-CM

## 2018-07-14 DIAGNOSIS — Z171 Estrogen receptor negative status [ER-]: Secondary | ICD-10-CM

## 2018-07-14 DIAGNOSIS — Z9012 Acquired absence of left breast and nipple: Secondary | ICD-10-CM

## 2018-07-14 DIAGNOSIS — C7802 Secondary malignant neoplasm of left lung: Secondary | ICD-10-CM | POA: Diagnosis not present

## 2018-08-04 DIAGNOSIS — Z9012 Acquired absence of left breast and nipple: Secondary | ICD-10-CM

## 2018-08-04 DIAGNOSIS — C50112 Malignant neoplasm of central portion of left female breast: Secondary | ICD-10-CM | POA: Diagnosis not present

## 2018-08-04 DIAGNOSIS — C7801 Secondary malignant neoplasm of right lung: Secondary | ICD-10-CM | POA: Diagnosis not present

## 2018-08-04 DIAGNOSIS — C7802 Secondary malignant neoplasm of left lung: Secondary | ICD-10-CM | POA: Diagnosis not present

## 2018-08-04 DIAGNOSIS — Z171 Estrogen receptor negative status [ER-]: Secondary | ICD-10-CM

## 2018-08-25 DIAGNOSIS — Z171 Estrogen receptor negative status [ER-]: Secondary | ICD-10-CM

## 2018-08-25 DIAGNOSIS — C7802 Secondary malignant neoplasm of left lung: Secondary | ICD-10-CM

## 2018-08-25 DIAGNOSIS — C50112 Malignant neoplasm of central portion of left female breast: Secondary | ICD-10-CM

## 2018-08-25 DIAGNOSIS — C7801 Secondary malignant neoplasm of right lung: Secondary | ICD-10-CM

## 2018-08-25 DIAGNOSIS — Z9012 Acquired absence of left breast and nipple: Secondary | ICD-10-CM

## 2018-08-25 DIAGNOSIS — D6481 Anemia due to antineoplastic chemotherapy: Secondary | ICD-10-CM

## 2018-08-25 DIAGNOSIS — D701 Agranulocytosis secondary to cancer chemotherapy: Secondary | ICD-10-CM

## 2018-09-10 ENCOUNTER — Emergency Department (HOSPITAL_COMMUNITY)
Admission: EM | Admit: 2018-09-10 | Discharge: 2018-09-11 | Disposition: A | Discharge status: Home / Self Care | Payer: BLUE CROSS/BLUE SHIELD | Attending: Emergency Medicine | Admitting: Emergency Medicine

## 2018-09-10 ENCOUNTER — Other Ambulatory Visit: Payer: Self-pay

## 2018-09-10 ENCOUNTER — Encounter (HOSPITAL_COMMUNITY): Payer: Self-pay

## 2018-09-10 DIAGNOSIS — J069 Acute upper respiratory infection, unspecified: Secondary | ICD-10-CM | POA: Insufficient documentation

## 2018-09-10 DIAGNOSIS — R509 Fever, unspecified: Secondary | ICD-10-CM | POA: Diagnosis present

## 2018-09-10 DIAGNOSIS — Z79899 Other long term (current) drug therapy: Secondary | ICD-10-CM | POA: Insufficient documentation

## 2018-09-10 DIAGNOSIS — Z87891 Personal history of nicotine dependence: Secondary | ICD-10-CM | POA: Insufficient documentation

## 2018-09-10 LAB — CBC WITH DIFFERENTIAL/PLATELET
Abs Immature Granulocytes: 0.07 10*3/uL (ref 0.00–0.07)
BASOS ABS: 0 10*3/uL (ref 0.0–0.1)
Basophils Relative: 0 %
Eosinophils Absolute: 0 10*3/uL (ref 0.0–0.5)
Eosinophils Relative: 0 %
HEMATOCRIT: 27.1 % — AB (ref 36.0–46.0)
HEMOGLOBIN: 8.5 g/dL — AB (ref 12.0–15.0)
Immature Granulocytes: 1 %
LYMPHS ABS: 1.4 10*3/uL (ref 0.7–4.0)
Lymphocytes Relative: 21 %
MCH: 33.5 pg (ref 26.0–34.0)
MCHC: 31.4 g/dL (ref 30.0–36.0)
MCV: 106.7 fL — AB (ref 80.0–100.0)
MONOS PCT: 8 %
Monocytes Absolute: 0.5 10*3/uL (ref 0.1–1.0)
NEUTROS PCT: 70 %
NRBC: 0 % (ref 0.0–0.2)
Neutro Abs: 4.5 10*3/uL (ref 1.7–7.7)
PLATELETS: 49 10*3/uL — AB (ref 150–400)
RBC: 2.54 MIL/uL — ABNORMAL LOW (ref 3.87–5.11)
RDW: 23.9 % — AB (ref 11.5–15.5)
WBC: 6.5 10*3/uL (ref 4.0–10.5)

## 2018-09-10 LAB — COMPREHENSIVE METABOLIC PANEL
ALT: 18 U/L (ref 0–44)
AST: 17 U/L (ref 15–41)
Albumin: 3.8 g/dL (ref 3.5–5.0)
Alkaline Phosphatase: 89 U/L (ref 38–126)
Anion gap: 7 (ref 5–15)
BILIRUBIN TOTAL: 0.2 mg/dL — AB (ref 0.3–1.2)
BUN: 5 mg/dL — AB (ref 6–20)
CO2: 24 mmol/L (ref 22–32)
CREATININE: 0.7 mg/dL (ref 0.44–1.00)
Calcium: 8.8 mg/dL — ABNORMAL LOW (ref 8.9–10.3)
Chloride: 105 mmol/L (ref 98–111)
GFR calc non Af Amer: >60 mL/min (ref >60)
Glucose, Bld: 94 mg/dL (ref 70–99)
POTASSIUM: 3.6 mmol/L (ref 3.5–5.1)
Sodium: 136 mmol/L (ref 135–145)
Total Protein: 6.4 g/dL — ABNORMAL LOW (ref 6.5–8.1)

## 2018-09-10 LAB — INFLUENZA PANEL BY PCR (TYPE A & B)
INFLAPCR: NEGATIVE
INFLBPCR: NEGATIVE

## 2018-09-10 LAB — I-STAT CG4 LACTIC ACID, ED: Lactic Acid, Venous: 0.79 mmol/L (ref 0.5–1.9)

## 2018-09-10 MED ORDER — SODIUM CHLORIDE 0.9 % IV BOLUS
1000.0000 mL | Freq: Once | INTRAVENOUS | Status: AC
  Administered 2018-09-10: 1000 mL via INTRAVENOUS

## 2018-09-10 MED ORDER — ACETAMINOPHEN 500 MG PO TABS
1000.0000 mg | ORAL_TABLET | Freq: Once | ORAL | Status: AC
  Administered 2018-09-10: 1000 mg via ORAL
  Filled 2018-09-10: qty 2

## 2018-09-10 NOTE — ED Provider Notes (Signed)
[facility] EMERGENCY DEPARTMENT Provider Note   CSN: [id number] Arrival date & time: 09/10/18  1737     History   Chief Complaint Chief Complaint  Patient presents with  . Generalized Body Aches  . Fever    HPI Kelly Hubbard is a 32 y.o. female.  Who presents for evaluation of flulike symptoms.  She is a past medical history of breast cancer with lung metastasis.  She is currently receiving chemotherapy treatment and has had some episodes of leukopenia.  She had a Neulasta shot last week.  She is around family and the kids got sick and called her and told her they had all just recently been diagnosed with the flu.  Patient began having body aches, fever, chills and productive cough today.  She has a mild headache.  She denies confusion, weakness.  She denies abdominal pain, nausea vomiting or urinary symptoms.  HPI  Past Medical History:  Diagnosis Date  . Breast cancer metastasized to lung [facility]) 01/2018    Patient Active Problem List   Diagnosis Date Noted  . Hypoxemia   . Pulmonary infiltrate   . Acute respiratory failure with hypoxemia ([place]) 04/08/2018  . Fever 04/08/2018  . Pneumonitis     Past Surgical History:  Procedure Laterality Date  . BACK SURGERY  06/1999  . CESAREAN SECTION    . CESAREAN SECTION WITH BILATERAL TUBAL LIGATION    . TEE WITHOUT CARDIOVERSION N/A 04/29/2018   Procedure: TRANSESOPHAGEAL ECHOCARDIOGRAM (TEE);  Surgeon: Nigel Mormon, MD;  Location: [facility] ENDOSCOPY;  Service: Cardiovascular;  Laterality: N/A;     OB History   None      Home Medications    Prior to Admission medications   Medication Sig Start Date End Date Taking? Authorizing Provider  acetaminophen (TYLENOL) 325 MG tablet Take 2 tablets (650 mg total) by mouth every 6 (six) hours as needed for mild pain, fever or headache. 04/15/18  Yes Georgette Shell, MD  ondansetron (ZOFRAN) 4 MG tablet Take 4 mg by mouth every 4 (four) hours  as needed for nausea or vomiting.  03/05/18  Yes [provider]  Pseudoeph-Doxylamine-DM-APAP (DAYQUIL/NYQUIL COLD/FLU RELIEF PO) Take 1 capsule by mouth every 6 (six) hours as needed (for cold or flu-like symptoms).    Yes [provider]  traMADol (ULTRAM) 50 MG tablet Take 50-100 mg by mouth every 6 (six) hours as needed (for pain).   Yes [provider]  benzonatate (TESSALON) 200 MG capsule Take 1 capsule (200 mg total) by mouth 2 (two) times daily as needed for cough. Patient not taking: Reported on 09/10/2018 04/15/18   Georgette Shell, MD  famotidine (PEPCID) 20 MG tablet Take 1 tablet (20 mg total) by mouth 2 (two) times daily. Patient not taking: Reported on 09/10/2018 04/15/18   Georgette Shell, MD  menthol-cetylpyridinium (CEPACOL) 3 MG lozenge Take 1 lozenge (3 mg total) by mouth as needed for sore throat. Patient not taking: Reported on 09/10/2018 04/15/18   Georgette Shell, MD    Family History Family History  Problem Relation Age of Onset  . Heart Problems Mother   . Hypertension Mother   . Diabetes Father   . Cancer Maternal Grandmother   . Stroke Maternal Grandmother     Social History Social History   Tobacco Use  . Smoking status: Former Smoker    Types: Cigarettes  . Smokeless tobacco: Never Used  Substance Use Topics  . Alcohol use: Not  Currently  . Drug use: Never     Allergies   Penicillins and Meperidine and related   Review of Systems Review of Systems  Ten systems reviewed and are negative for acute change, except as noted in the HPI.   Physical Exam Updated Vital Signs BP 98/71   Pulse 98   Temp 99.7 F (37.6 C) (Oral)   Resp 12   Ht 4\' 11"  (1.499 m)   Wt 63.5 kg   SpO2 100%   BMI 28.28 kg/m   Physical Exam Physical Exam  Nursing note and vitals reviewed. Constitutional: She is oriented to person, place, and time. She appears well-developed and well-nourished. No distress.  HENT:  Head:  Normocephalic and atraumatic.  Eyes: Conjunctivae normal and EOM are normal. Pupils are equal, round, and reactive to light. No scleral icterus.  Neck: Normal range of motion.  Cardiovascular: Normal rate, regular rhythm and normal heart sounds.  Exam reveals no gallop and no friction rub.   No murmur heard. Pulmonary/Chest: Effort normal and breath sounds normal. No respiratory distress.  Abdominal: Soft. Bowel sounds are normal. She exhibits no distension and no mass. There is no tenderness. There is no guarding.  Neurological: She is alert and oriented to person, place, and time.  Skin: Skin is warm and dry. She is not diaphoretic.     ED Treatments / Results  Labs (all labs ordered are listed, but only abnormal results are displayed) Labs Reviewed  RESPIRATORY PANEL BY PCR  COMPREHENSIVE METABOLIC PANEL  CBC WITH DIFFERENTIAL/PLATELET  URINALYSIS, ROUTINE W REFLEX MICROSCOPIC  INFLUENZA PANEL BY PCR (TYPE A & B)    EKG None  Radiology No results found.  Procedures Procedures (including critical care time)  Medications Ordered in ED Medications  sodium chloride 0.9 % bolus 1,000 mL (1,000 mLs Intravenous New Bag/Given 09/10/18 2157)  acetaminophen (TYLENOL) tablet 1,000 mg (1,000 mg Oral Given 09/10/18 2155)     Initial Impression / Assessment and Plan / ED Course  I have reviewed the triage vital signs and the nursing notes.  Pertinent labs & imaging results that were available during my care of the patient were reviewed by me and considered in my medical decision making (see chart for details).  Clinical Course as of Sep 11 9  Wed Sep 10, 2018  2338 HCT(!): 27.1 [AH]    Clinical Course User Index [AH] Margarita Mail, PA-C    Patient with pmh of breast cancer currently under chemo treatment. Her WBC is  Normal . Her Influenza has returned and is negative. I am awaiting UA.  I personally reviewed the paitnet's PA/Lat CXR which is negative for any  Pneumonia. Patient given in sign out to PA [place]. Expect d/c  Final Clinical Impressions(s) / ED Diagnoses   Final diagnoses:  None    ED Discharge Orders    None       Margarita Mail, PA-C 09/13/18 3244    Pattricia Boss, MD 09/14/18 [phone]

## 2018-09-10 NOTE — ED Triage Notes (Signed)
Pt reports flu like symptoms since this morning (fever, chills, body aches, diarrhea). Pt has stage 4 breast cancer with active chemo treatments. Was around family members who had similar symptoms.

## 2018-09-11 LAB — URINALYSIS, ROUTINE W REFLEX MICROSCOPIC
Bilirubin Urine: NEGATIVE
Glucose, UA: NEGATIVE mg/dL
Ketones, ur: NEGATIVE mg/dL
LEUKOCYTES UA: NEGATIVE
NITRITE: NEGATIVE
Protein, ur: NEGATIVE mg/dL
SPECIFIC GRAVITY, URINE: 1.006 (ref 1.005–1.030)
pH: 7 (ref 5.0–8.0)

## 2018-09-11 LAB — RESPIRATORY PANEL BY PCR
Adenovirus: NOT DETECTED
Bordetella pertussis: NOT DETECTED
Chlamydophila pneumoniae: NOT DETECTED
Coronavirus 229E: NOT DETECTED
Coronavirus HKU1: NOT DETECTED
Coronavirus NL63: NOT DETECTED
Coronavirus OC43: NOT DETECTED
INFLUENZA B-RVPPCR: NOT DETECTED
Influenza A: NOT DETECTED
Metapneumovirus: NOT DETECTED
Mycoplasma pneumoniae: NOT DETECTED
PARAINFLUENZA VIRUS 1-RVPPCR: NOT DETECTED
PARAINFLUENZA VIRUS 4-RVPPCR: NOT DETECTED
Parainfluenza Virus 2: NOT DETECTED
Parainfluenza Virus 3: NOT DETECTED
RESPIRATORY SYNCYTIAL VIRUS-RVPPCR: NOT DETECTED
Rhinovirus / Enterovirus: NOT DETECTED

## 2018-09-11 LAB — LIPASE, BLOOD: Lipase: 27 U/L (ref 11–51)

## 2018-09-11 NOTE — ED Provider Notes (Signed)
Current chemo with mets to lung URI/Flu sxs today (flu neg here) Respiratory panel pending, UA pending  Plan: home with URI   Labs reviewed. REspiratory panel negative. UA negative. The patient is nontoxic in appearance. She can be discharged home per plan of previous treatment team.    Charlann Lange, PA-C 09/11/18 5910    Pattricia Boss, MD 09/11/18 [phone]

## 2018-09-11 NOTE — Discharge Instructions (Signed)
You appear to have an upper respiratory infection (URI). An upper respiratory tract infection, or cold, is a viral infection of the air passages leading to the lungs. It is contagious and can be spread to others, especially during the first 3 or 4 days. It cannot be cured by antibiotics or other medicines. °RETURN IMMEDIATELY IF you develop shortness of breath, confusion or altered mental status, a new rash, become dizzy, faint, or poorly responsive, or are unable to be cared for at home. ° °

## 2018-09-11 NOTE — ED Notes (Signed)
Pt was discharged during down time. PT states understanding of care given, follow up care, and medication prescribed. PT ambulated from ED to car with a steady gait.

## 2018-09-15 DIAGNOSIS — Z9012 Acquired absence of left breast and nipple: Secondary | ICD-10-CM | POA: Diagnosis not present

## 2018-09-15 DIAGNOSIS — C7802 Secondary malignant neoplasm of left lung: Secondary | ICD-10-CM | POA: Diagnosis not present

## 2018-09-15 DIAGNOSIS — C50112 Malignant neoplasm of central portion of left female breast: Secondary | ICD-10-CM

## 2018-09-15 DIAGNOSIS — Z171 Estrogen receptor negative status [ER-]: Secondary | ICD-10-CM

## 2018-09-15 DIAGNOSIS — C7801 Secondary malignant neoplasm of right lung: Secondary | ICD-10-CM

## 2018-10-06 DIAGNOSIS — D701 Agranulocytosis secondary to cancer chemotherapy: Secondary | ICD-10-CM

## 2018-10-06 DIAGNOSIS — C7802 Secondary malignant neoplasm of left lung: Secondary | ICD-10-CM

## 2018-10-06 DIAGNOSIS — C50112 Malignant neoplasm of central portion of left female breast: Secondary | ICD-10-CM | POA: Diagnosis not present

## 2018-10-06 DIAGNOSIS — C7801 Secondary malignant neoplasm of right lung: Secondary | ICD-10-CM | POA: Diagnosis not present

## 2018-10-06 DIAGNOSIS — Z9012 Acquired absence of left breast and nipple: Secondary | ICD-10-CM

## 2018-10-06 DIAGNOSIS — Z171 Estrogen receptor negative status [ER-]: Secondary | ICD-10-CM

## 2018-11-03 DIAGNOSIS — C50112 Malignant neoplasm of central portion of left female breast: Secondary | ICD-10-CM

## 2018-11-03 DIAGNOSIS — R0789 Other chest pain: Secondary | ICD-10-CM

## 2018-11-03 DIAGNOSIS — F329 Major depressive disorder, single episode, unspecified: Secondary | ICD-10-CM

## 2018-11-03 DIAGNOSIS — E861 Hypovolemia: Secondary | ICD-10-CM

## 2018-11-03 DIAGNOSIS — Z9012 Acquired absence of left breast and nipple: Secondary | ICD-10-CM

## 2018-11-03 DIAGNOSIS — C7802 Secondary malignant neoplasm of left lung: Secondary | ICD-10-CM

## 2018-11-03 DIAGNOSIS — Z5111 Encounter for antineoplastic chemotherapy: Secondary | ICD-10-CM

## 2018-11-03 DIAGNOSIS — Z170 Estrogen receptor positive status [ER+]: Secondary | ICD-10-CM

## 2018-11-03 DIAGNOSIS — C7801 Secondary malignant neoplasm of right lung: Secondary | ICD-10-CM

## 2018-11-24 DIAGNOSIS — C7801 Secondary malignant neoplasm of right lung: Secondary | ICD-10-CM

## 2018-11-24 DIAGNOSIS — C50112 Malignant neoplasm of central portion of left female breast: Secondary | ICD-10-CM | POA: Diagnosis not present

## 2018-11-24 DIAGNOSIS — Z9012 Acquired absence of left breast and nipple: Secondary | ICD-10-CM

## 2018-11-24 DIAGNOSIS — C7802 Secondary malignant neoplasm of left lung: Secondary | ICD-10-CM

## 2018-11-24 DIAGNOSIS — Z171 Estrogen receptor negative status [ER-]: Secondary | ICD-10-CM

## 2018-12-29 DIAGNOSIS — Z171 Estrogen receptor negative status [ER-]: Secondary | ICD-10-CM | POA: Diagnosis not present

## 2018-12-29 DIAGNOSIS — C50112 Malignant neoplasm of central portion of left female breast: Secondary | ICD-10-CM

## 2019-01-15 DIAGNOSIS — C50112 Malignant neoplasm of central portion of left female breast: Secondary | ICD-10-CM | POA: Diagnosis not present

## 2019-01-15 DIAGNOSIS — Z171 Estrogen receptor negative status [ER-]: Secondary | ICD-10-CM

## 2019-01-29 DIAGNOSIS — Z171 Estrogen receptor negative status [ER-]: Secondary | ICD-10-CM

## 2019-01-29 DIAGNOSIS — C50112 Malignant neoplasm of central portion of left female breast: Secondary | ICD-10-CM | POA: Diagnosis not present

## 2019-03-05 DIAGNOSIS — C50112 Malignant neoplasm of central portion of left female breast: Secondary | ICD-10-CM

## 2019-03-05 DIAGNOSIS — Z171 Estrogen receptor negative status [ER-]: Secondary | ICD-10-CM | POA: Diagnosis not present

## 2019-04-02 DIAGNOSIS — C50112 Malignant neoplasm of central portion of left female breast: Secondary | ICD-10-CM

## 2019-04-24 DIAGNOSIS — C50112 Malignant neoplasm of central portion of left female breast: Secondary | ICD-10-CM

## 2019-05-15 DIAGNOSIS — C50112 Malignant neoplasm of central portion of left female breast: Secondary | ICD-10-CM

## 2019-05-29 DIAGNOSIS — C50112 Malignant neoplasm of central portion of left female breast: Secondary | ICD-10-CM

## 2019-06-01 DIAGNOSIS — C50112 Malignant neoplasm of central portion of left female breast: Secondary | ICD-10-CM

## 2019-06-24 DIAGNOSIS — C50112 Malignant neoplasm of central portion of left female breast: Secondary | ICD-10-CM

## 2019-07-08 ENCOUNTER — Inpatient Hospital Stay
Admission: EM | Admit: 2019-07-08 | Payer: Medicaid Other | Source: Other Acute Inpatient Hospital | Admitting: Internal Medicine

## 2019-07-09 MED ORDER — METHADONE HCL 10 MG PO TABS
5.00 | ORAL_TABLET | ORAL | Status: DC
Start: 2019-07-09 — End: 2019-07-09

## 2019-07-09 MED ORDER — ISOVUE-M 300 61 % IJ SOLN
4.00 | INTRAMUSCULAR | Status: DC
Start: ? — End: 2019-07-09

## 2019-07-09 MED ORDER — DEXAMETHASONE 4 MG PO TABS
4.00 | ORAL_TABLET | ORAL | Status: DC
Start: 2019-07-09 — End: 2019-07-09

## 2019-07-09 MED ORDER — POLYETHYLENE GLYCOL 3350 17 G PO PACK
17.00 | PACK | ORAL | Status: DC
Start: 2019-07-10 — End: 2019-07-09

## 2019-07-09 MED ORDER — OXYCODONE HCL 5 MG PO TABS
10.00 | ORAL_TABLET | ORAL | Status: DC
Start: ? — End: 2019-07-09

## 2019-07-22 DIAGNOSIS — C7960 Secondary malignant neoplasm of unspecified ovary: Secondary | ICD-10-CM | POA: Diagnosis not present

## 2019-07-22 DIAGNOSIS — C787 Secondary malignant neoplasm of liver and intrahepatic bile duct: Secondary | ICD-10-CM

## 2019-07-22 DIAGNOSIS — C7931 Secondary malignant neoplasm of brain: Secondary | ICD-10-CM

## 2019-07-22 DIAGNOSIS — C7800 Secondary malignant neoplasm of unspecified lung: Secondary | ICD-10-CM

## 2019-07-22 DIAGNOSIS — C50112 Malignant neoplasm of central portion of left female breast: Secondary | ICD-10-CM

## 2019-09-01 DEATH — deceased
